# Patient Record
Sex: Female | Born: 1955 | Race: Black or African American | Hispanic: No | Smoking: Never smoker
Health system: Southern US, Community
[De-identification: ages and names within clinical notes are randomized; demographics above are authoritative.]

## PROBLEM LIST (undated history)

## (undated) DIAGNOSIS — I10 Essential (primary) hypertension: Secondary | ICD-10-CM

## (undated) DIAGNOSIS — E785 Hyperlipidemia, unspecified: Secondary | ICD-10-CM

## (undated) DIAGNOSIS — F419 Anxiety disorder, unspecified: Secondary | ICD-10-CM

## (undated) DIAGNOSIS — G7100 Muscular dystrophy, unspecified: Secondary | ICD-10-CM

## (undated) HISTORY — DX: Anxiety disorder, unspecified: F41.9

## (undated) HISTORY — DX: Essential (primary) hypertension: I10

## (undated) HISTORY — PX: BUNIONECTOMY: SHX129

## (undated) HISTORY — DX: Muscular dystrophy, unspecified: G71.00

## (undated) HISTORY — DX: Hyperlipidemia, unspecified: E78.5

---

## 2003-11-23 ENCOUNTER — Ambulatory Visit: Payer: Self-pay | Admitting: Podiatry

## 2008-04-05 ENCOUNTER — Ambulatory Visit: Payer: Self-pay | Admitting: Family Medicine

## 2009-04-18 ENCOUNTER — Ambulatory Visit: Payer: Self-pay | Admitting: Family Medicine

## 2010-12-10 ENCOUNTER — Ambulatory Visit: Payer: Self-pay | Admitting: Family Medicine

## 2011-12-11 ENCOUNTER — Ambulatory Visit: Payer: Self-pay | Admitting: Family Medicine

## 2012-01-31 ENCOUNTER — Observation Stay: Payer: Self-pay | Admitting: Specialist

## 2012-01-31 LAB — CK TOTAL AND CKMB (NOT AT ARMC)
CK, Total: 281 U/L — ABNORMAL HIGH (ref 21–215)
CK-MB: 13.6 ng/mL — ABNORMAL HIGH (ref 0.5–3.6)

## 2012-01-31 LAB — COMPREHENSIVE METABOLIC PANEL
Albumin: 4.2 g/dL (ref 3.4–5.0)
Alkaline Phosphatase: 67 U/L (ref 50–136)
Anion Gap: 9 (ref 7–16)
BUN: 10 mg/dL (ref 7–18)
Chloride: 109 mmol/L — ABNORMAL HIGH (ref 98–107)
Creatinine: 0.44 mg/dL — ABNORMAL LOW (ref 0.60–1.30)
EGFR (African American): >60
Osmolality: 286 (ref 275–301)
Potassium: 3.6 mmol/L (ref 3.5–5.1)
SGOT(AST): 29 U/L (ref 15–37)
SGPT (ALT): 27 U/L (ref 12–78)
Sodium: 144 mmol/L (ref 136–145)

## 2012-01-31 LAB — CBC
HCT: 38.5 % (ref 35.0–47.0)
MCHC: 34.6 g/dL (ref 32.0–36.0)
MCV: 96 fL (ref 80–100)
RBC: 4.03 10*6/uL (ref 3.80–5.20)
RDW: 13.1 % (ref 11.5–14.5)
WBC: 9.5 10*3/uL (ref 3.6–11.0)

## 2012-01-31 LAB — PROTIME-INR: Prothrombin Time: 11.5 secs (ref 11.5–14.7)

## 2012-01-31 LAB — TROPONIN I: Troponin-I: <0.02 ng/mL

## 2012-02-01 DIAGNOSIS — I517 Cardiomegaly: Secondary | ICD-10-CM

## 2012-02-01 LAB — URINALYSIS, COMPLETE
Bacteria: NONE SEEN
Glucose,UR: NEGATIVE mg/dL (ref 0–75)
Ketone: NEGATIVE
Leukocyte Esterase: NEGATIVE
Nitrite: NEGATIVE
Ph: 7 (ref 4.5–8.0)
Protein: NEGATIVE
Squamous Epithelial: <1
WBC UR: NONE SEEN /HPF (ref 0–5)

## 2012-02-01 LAB — LIPID PANEL
Cholesterol: 151 mg/dL (ref 0–200)
Ldl Cholesterol, Calc: 45 mg/dL (ref 0–100)
Triglycerides: 58 mg/dL (ref 0–200)
VLDL Cholesterol, Calc: 12 mg/dL (ref 5–40)

## 2014-05-19 NOTE — H&P (Signed)
PATIENT NAME:  Jeanne Taylor, Jeanne Taylor MR#:  [id number] DATE OF BIRTH:  [birth year]  DATE OF ADMISSION:  01/31/2012  PRIMARY CARE PHYSICIAN: Dr. Dennison MascotLemont Morrisey.   CHIEF COMPLAINT: Left-sided weakness and numbness and difficulty ambulating.   HISTORY OF PRESENT ILLNESS: This is a 59 year old female who presents to the Emergency Room due to an acute onset of left right-sided weakness and numbness that began around 8:00 this morning. The patient has history of muscular dystrophy and has some residual weakness all the time but this was a bit different from her weakness. The way she describes it is that when she got up and started moving, her legs did not seem to go the way she wanted it to. She felt also some tingling in her left side of her face. She, therefore, told her grandson and who brought her to the ER for further evaluation. She continues to still have some weakness on the left side, but the numbness and tingling has since then resolved. She initially did have a headache but took some BC powder, which seems to have improved her headache. She also admits to some nausea but no vomiting. She denied any chest pain, shortness of breath, fevers, chills, cough, or any other associated symptoms.   REVIEW OF SYSTEMS: CONSTITUTIONAL: No documented fever. No weight gain or weight loss.  EYES: No blurred or double vision.  ENT: No tinnitus. No postnasal drip. No redness of the oropharynx.  RESPIRATORY: No cough, no wheeze, no hemoptysis, no dyspnea.  CARDIOVASCULAR: No chest pain, no orthopnea, no palpitations, no syncope.  GASTROINTESTINAL: Positive nausea. No vomiting, diarrhea, no abdominal pain, no melena, ne hematochezia. GENITOURINARY: No dysuria, hematuria.  ENDOCRINE: No polyuria or nocturia. No heat or cold intolerance.  HEMATOLOGIC: No anemia. No bruising. No bleeding.  INTEGUMENTARY: No rashes. No lesions.  MUSCULOSKELETAL: No arthritis, no swelling, no gout.  NEUROLOGIC: Positive numbness. Positive  tingling, no ataxia, no seizure-type activity.  PSYCH: No anxiety, no insomnia, no ADD.    PAST MEDICAL HISTORY: Consistent with muscular dystrophy and hypertension.   ALLERGIES: No known drug allergies.   SOCIAL HISTORY: No smoking, drinks about 4 to 5 beers daily. No illicit drug abuse. Lives at home with her grandson.   FAMILY HISTORY: The patient's mother died from colon cancer. The father had a history of hypertension, and also had strokes.   CURRENT MEDICATIONS: Amlodipine 10 mg daily.   PHYSICAL EXAMINATION ON ADMISSION:  VITAL SIGNS: Temperature: She is afebrile, pulse 104, respirations 24, blood pressure 154/91, saturation 97% on room air.  GENERAL: She is a pleasant appearing female in no apparent distress.  HEENT: Atraumatic, normocephalic. Extraocular muscles are intact. Pupils equal, reactive to light. Sclerae anicteric. No conjunctival injection. No pharyngeal erythema.  NECK: Supple. No jugular venous distention, no bruits, no lymphadenopathy. No thyromegaly.  HEART: Regular rate and rhythm. No murmurs, rubs, or clicks.  LUNGS: Clear to auscultation bilaterally. No rales or rhonchi, no wheezes.  ABDOMEN: Soft, flat, nontender, nondistended. Has good bowel sounds. No hepatosplenomegaly appreciated.  EXTREMITIES: No evidence of any cyanosis, clubbing, or peripheral edema. Has +2 pedal and radial pulses bilaterally.  NEUROLOGICAL: The patient is alert, awake, and oriented x 3. No focal motor or sensory deficits appreciated bilaterally. Negative finger to nose. Negative heel-to-shin, +2 reflexes bilaterally. No evidence of any ataxia.  SKIN: Moist and warm with no rashes.  LYMPHATIC: There is no cervical or axillary lymphadenopathy.   LABORATORY, DIAGNOSTIC, AND RADIOLOGICAL DATA: Serum glucose 99, BUN 10,  creatinine 0.4,  sodium 144, potassium 3.6, chloride 109, bicarb 26. LFTs are within normal limits. CK 281, CK-MB 13.6. White cell count of 9.5, hemoglobin 13.4, hematocrit  38.5, platelet count of 242. INR of 0.8. The patient did have a CT of the head done which showed no evidence of any acute intracranial abnormalities.   ASSESSMENT AND PLAN: This is a 59 year old female with history of muscular dystrophy and hypertension who presents to the hospital with left-sided weakness, numbness, and difficulty ambulating.   1. Left-sided weakness/numbness. The patient's clinical symptoms are worrisome suspicious for possible transient ischemic attack/cerebral vascular accident. Her symptoms seems to have improved since they began around 8:00 this morning. Her initial CT of the head is negative. I will go ahead and get a MRI of her brain without contrast. I will also get a carotid duplex and a two-dimensional echocardiogram, continue her on aspirin. Check a lipid profile in the morning. Follow neuro checks every four hours and follow her clinically.  2. Hypertension. Continue with amlodipine, presently hemodynamically stable.  3. Muscular dystrophy. She has had this since childhood. We will get a Physical Therapy consult to assess her mobility.   CODE STATUS: The patient is a FULL CODE.   TIME SPENT ON THE ADMISSION: 45 minutes.   ____________________________ Rolly Pancake. Cherlynn Kaiser, MD vjs:jm D: 01/31/2012 13:44:34 ET T: 01/31/2012 14:28:49 ET JOB#: [id number]  cc: Rolly Pancake. Cherlynn Kaiser, MD, <Dictator> Houston Siren MD ELECTRONICALLY SIGNED 02/02/2012 8:17

## 2014-05-19 NOTE — Discharge Summary (Signed)
DATE OF BIRTH:  1955/03/31  HISTORY OF PRESENT ILLNESS:  For a detailed note, please take a look at the history and physical done on admission.   DIAGNOSES AT DISCHARGE:  Left-sided weakness and facial numbness, transient ischemic attack ruled out, history of hypertension, history of muscular dystrophy.   DIET:  The patient is being discharged on a low-sodium diet.   ACTIVITY:  As tolerated.   FOLLOWUP:  With Dr. Dennison MascotLemont Morrisey in the next 1 to 2 weeks.    DISCHARGE MEDICATIONS:  Aspirin 81 mg daily and amlodipine/benazepril combination 10/20 one tab daily.   PERTINENT STUDIES DONE DURING THE HOSPITAL COURSE:  CT scan of the head done on admission without contrast showing no evidence of acute intracranial abnormalities. An ultrasound of the carotids showing no evidence of any hemodynamically significant carotid stenosis. An MRI of the brain done without contrast also showing no evidence of any acute abnormalities. Also a 2-dimensional echocardiogram done, which shows no cardiac source of emboli. Left ventricular systolic function to be normal, mild LVH. No evidence of pulmonary hypertension.   BRIEF HOSPITAL COURSE:  This is a 59 year old female, who presented to the hospital with left-sided weakness and facial numbness.  1.  Left-sided weakness and facial numbness and difficulty walking. The patient was admitted to the hospital, as there was a concern that the patient may have had a TIA or a suspected CVA. She was observed overnight on off unit telemetry, underwent extensive testing to work up a neurologic cause of her symptoms, which were all negative including the CT head, MRI, carotid duplex and 2-dimensional echo. Her symptoms had transiently since then resolved. Therefore, TIA currently has been ruled out. The patient also had a lipid profile checked, which was essentially normal. Presently, since she has no neurological symptoms with negative imaging, she is being discharged home on a baby  aspirin as mentioned.  2.  History of hypertension. The patient remained hemodynamically stable in the hospital. She will continue amlodipine/benazepril as stated.  3.  Muscular dystrophy. The patient had no acute symptoms related to this at this point.   CODE STATUS:  The patient is a FULL CODE.   TIME SPENT ON DISCHARGE:  35 minutes.    ____________________________ Rolly PancakeVivek J. Cherlynn KaiserSainani, MD vjs:ms D: 02/01/2012 14:09:17 ET T: 02/01/2012 20:39:59 ET JOB#: [id number]  cc: Rolly PancakeVivek J. Cherlynn KaiserSainani, MD, <Dictator> Dennison MascotLemont Morrisey, MD Houston SirenVIVEK J Timothey Dahlstrom MD ELECTRONICALLY SIGNED 02/02/2012 8:17

## 2014-10-31 ENCOUNTER — Other Ambulatory Visit: Payer: Self-pay | Admitting: Family Medicine

## 2015-06-02 ENCOUNTER — Other Ambulatory Visit: Payer: Self-pay | Admitting: Family Medicine

## 2015-07-03 ENCOUNTER — Encounter: Payer: Self-pay | Admitting: Family Medicine

## 2015-07-03 ENCOUNTER — Ambulatory Visit (INDEPENDENT_AMBULATORY_CARE_PROVIDER_SITE_OTHER): Payer: Managed Care, Other (non HMO) | Admitting: Family Medicine

## 2015-07-03 VITALS — BP 110/68 | HR 114 | Temp 98.5°F | Resp 18 | Ht 65.0 in | Wt 140.1 lb

## 2015-07-03 DIAGNOSIS — G7100 Muscular dystrophy, unspecified: Secondary | ICD-10-CM

## 2015-07-03 DIAGNOSIS — J309 Allergic rhinitis, unspecified: Secondary | ICD-10-CM | POA: Diagnosis not present

## 2015-07-03 DIAGNOSIS — I10 Essential (primary) hypertension: Secondary | ICD-10-CM | POA: Diagnosis not present

## 2015-07-03 MED ORDER — BENAZEPRIL HCL 20 MG PO TABS: 20.0000 mg | ORAL_TABLET | Freq: Every day | ORAL | Status: DC

## 2015-07-03 MED ORDER — AMLODIPINE BESYLATE 10 MG PO TABS: 10.0000 mg | ORAL_TABLET | Freq: Every day | ORAL | Status: DC

## 2015-07-03 NOTE — Progress Notes (Signed)
Date:  07/03/2015   Name:  Jeanne Taylor   DOB:  07/28/1955   MRN:  [id number]  PCP:  Dennison MascotLemont Morrisey, MD    Chief Complaint: Medication Refill   History of Present Illness:  This is a 60 y.o. female requesting BP med refills. Hx AR and muscular dystrophy.  Review of Systems:  Review of Systems  Constitutional: Negative for fever and fatigue.  Respiratory: Negative for cough and shortness of breath.   Cardiovascular: Negative for chest pain and leg swelling.  Neurological: Negative for syncope and light-headedness.    Patient Active Problem List   Diagnosis Date Noted  . Hypertension 07/03/2015  . Allergic rhinitis 07/03/2015  . Muscular dystrophy (HCC) 07/03/2015    Prior to Admission medications   Medication Sig Start Date End Date Taking? Authorizing Provider  amLODipine (NORVASC) 10 MG tablet Take 1 tablet (10 mg total) by mouth daily. 07/03/15   [name], MD  benazepril (LOTENSIN) 20 MG tablet Take 1 tablet (20 mg total) by mouth daily. 07/03/15   [name], MD    No Known Allergies  No past surgical history on file.  Social History  Substance Use Topics  . Smoking status: Never Smoker   . Smokeless tobacco: None  . Alcohol Use: 0.0 oz/week    0 Standard drinks or equivalent per week    Family History  Problem Relation Age of Onset  . Hypertension Father     Medication list has been reviewed and updated.  Physical Examination: BP 110/68 mmHg  Pulse 114  Temp(Src) 98.5 F (36.9 C)  Resp 18  Ht 5\' 5"  (1.651 m)  Wt 140 lb 1 oz (63.532 kg)  BMI 23.31 kg/m2  SpO2 97%  Physical Exam  Constitutional: She appears well-developed and well-nourished.  Cardiovascular: Normal rate, regular rhythm and normal heart sounds.   Pulmonary/Chest: Effort normal and breath sounds normal.  Musculoskeletal: She exhibits no edema.  Neurological: She is alert.  Skin: Skin is warm and dry.  Psychiatric: She has a normal mood and affect. Her behavior is normal.   Nursing note and vitals reviewed.   Assessment and Plan:  1. Essential hypertension Well controlled on current meds, refill benezapril and amlodipine  2. Allergic rhinitis, unspecified allergic rhinitis type  3. Muscular dystrophy (HCC)  Return in about 1 year (around 07/02/2016).  Dionne AnoWilliam M. Kingsley SpittlePlonk, Jr. MD [facility]  07/03/2015

## 2015-07-07 ENCOUNTER — Other Ambulatory Visit: Payer: Self-pay | Admitting: Family Medicine

## 2015-07-08 ENCOUNTER — Other Ambulatory Visit: Payer: Self-pay | Admitting: Family Medicine

## 2015-09-21 ENCOUNTER — Ambulatory Visit (INDEPENDENT_AMBULATORY_CARE_PROVIDER_SITE_OTHER): Payer: Managed Care, Other (non HMO) | Admitting: Primary Care

## 2015-09-21 ENCOUNTER — Encounter: Payer: Self-pay | Admitting: Primary Care

## 2015-09-21 VITALS — BP 130/82 | HR 95 | Temp 97.8°F | Ht 65.0 in | Wt 140.4 lb

## 2015-09-21 DIAGNOSIS — J309 Allergic rhinitis, unspecified: Secondary | ICD-10-CM

## 2015-09-21 DIAGNOSIS — I10 Essential (primary) hypertension: Secondary | ICD-10-CM

## 2015-09-21 DIAGNOSIS — G7100 Muscular dystrophy, unspecified: Secondary | ICD-10-CM

## 2015-09-21 DIAGNOSIS — Z800 Family history of malignant neoplasm of digestive organs: Secondary | ICD-10-CM | POA: Insufficient documentation

## 2015-09-21 DIAGNOSIS — R531 Weakness: Secondary | ICD-10-CM

## 2015-09-21 LAB — CBC
HCT: 41 % (ref 35.0–45.0)
HEMOGLOBIN: 13.8 g/dL (ref 11.7–15.5)
MCH: 32.1 pg (ref 27.0–33.0)
MCHC: 33.7 g/dL (ref 32.0–36.0)
MCV: 95.3 fL (ref 80.0–100.0)
MPV: 10.9 fL (ref 7.5–12.5)
Platelets: 270 10*3/uL (ref 140–400)
RBC: 4.3 MIL/uL (ref 3.80–5.10)
RDW: 13.9 % (ref 11.0–15.0)
WBC: 7 10*3/uL (ref 3.8–10.8)

## 2015-09-21 LAB — COMPREHENSIVE METABOLIC PANEL
ALBUMIN: 4.3 g/dL (ref 3.6–5.1)
ALK PHOS: 46 U/L (ref 33–130)
ALT: 16 U/L (ref 6–29)
AST: 18 U/L (ref 10–35)
BILIRUBIN TOTAL: 0.7 mg/dL (ref 0.2–1.2)
BUN: 11 mg/dL (ref 7–25)
CO2: 27 mmol/L (ref 20–31)
CREATININE: 0.54 mg/dL (ref 0.50–0.99)
Calcium: 9.8 mg/dL (ref 8.6–10.4)
Chloride: 103 mmol/L (ref 98–110)
Glucose, Bld: 100 mg/dL — ABNORMAL HIGH (ref 65–99)
Potassium: 4 mmol/L (ref 3.5–5.3)
SODIUM: 141 mmol/L (ref 135–146)
TOTAL PROTEIN: 7 g/dL (ref 6.1–8.1)

## 2015-09-21 LAB — TSH: TSH: 2 m[IU]/L

## 2015-09-21 NOTE — Assessment & Plan Note (Signed)
Diagnosed in 1980's. No recent neurology follow up in 3 years. Neuro exam today unremarkable for CVA.  Good strength bilaterally. Cannot afford PT at this time. Will send to neurology for further evaluation and recommendations given progressive weakness. Labs pending today to rule out any other cause for weakness.

## 2015-09-21 NOTE — Assessment & Plan Note (Signed)
Stable in clinic today. Continue benazepril and amlodipine.

## 2015-09-21 NOTE — Assessment & Plan Note (Signed)
Increased PND and congestion. Start Zyrtec HS.  Patient to update me if no improvement.

## 2015-09-21 NOTE — Assessment & Plan Note (Signed)
Family history of her mother who was diagnosed at age 60. Strongly encouraged colonoscopy as patient has never completed. Patient will think about this and notify me of her answer.

## 2015-09-21 NOTE — Patient Instructions (Signed)
You will be contacted regarding your referral to Neurology.  Please let us know if you have not heard back within one week.   Complete lab work prior to leaving today. I will notify you of your results once received.   Please drop off a copy of your biometric lab results at your convenience.   It was a pleasure to meet you today! Please don't hesitate to call me with any questions. Welcome to [facility]!

## 2015-09-21 NOTE — Progress Notes (Signed)
Pre visit review using our clinic review tool, if applicable. No additional management support is needed unless otherwise documented below in the visit note. 

## 2015-09-21 NOTE — Progress Notes (Signed)
Subjective:    Patient ID: Jeanne Taylor, female    DOB: 02/14/1955, 60 y.o.   MRN: [id number]  HPI  Ms. Jeanne Taylor is a 60 year old female who presents today to establish care and discuss the problems mentioned below. Will obtain old records. She completed biometric testing at work several months ago and endorses a normal cholesterol reading.   1) Essential Hypertension: Diagnosed years ago. Currently managed on Amlodipine 10 mg and Benazepril 20 mg. She checks her blood pressure intermittently at work with normal readings.   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 Her weakness as progressed over the years since diagnosis. Over the past 3 years she's noticed more of a progression of her weakness mostly to the left side.   Over the past several weeks she has experienced weakness to her left upper and lower extremities where she feels like she cannot hold herself up. She has noticed numbness/tingling to her finger tips on the left side.   Denies dizziness, chest pain, nausea, shortness of breath, neck pain. Full stroke work up in the ED in 2014 which was negative. She [facility] who recommended physical therapy for which she could not afford. Her last visit with the neurologist was three years ago.   3) Family history of Colon Cancer: Family history in mother who was diagnosed at age 60. She has never had a colonoscopy. Denies unexplained weight loss, bloody stools, abdominal pain.   Review of Systems  Constitutional: Negative for fatigue and unexpected weight change.  HENT: Positive for postnasal drip.   Respiratory: Negative for shortness of breath.   Cardiovascular: Negative for chest pain.  Gastrointestinal: Negative for blood in stool, constipation and diarrhea.  Neurological: Positive for weakness. Negative for dizziness, facial asymmetry, speech difficulty, numbness and headaches.       Past Medical History:    Diagnosis Date  . Anxiety   . Hyperlipidemia   . Hypertension      Social History   Social History  . Marital status: Divorced    Spouse name: N/A  . Number of children: N/A  . Years of education: N/A   Occupational History  . Not on file.   Social History Main Topics  . Smoking status: Never Smoker  . Smokeless tobacco: Never Used  . Alcohol use 0.0 oz/week     Comment: social  . Drug use: Unknown  . Sexual activity: Not on file   Other Topics Concern  . Not on file   Social History Narrative   Single.   1 daughter, adopted.   No grandchildren.   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   Lives at home with her Harlow MaresGodson.    Enjoys cooking, cleaning, decorating.     Past Surgical History:  Procedure Laterality Date  . BUNIONECTOMY Right     Family History  Problem Relation Age of Onset  . Colon cancer Mother   . Hypertension Father   . Muscular dystrophy Brother     No Known Allergies  Current Outpatient Prescriptions on File Prior to Visit  Medication Sig Dispense Refill  . amLODipine (NORVASC) 10 MG tablet Take 1 tablet (10 mg total) by mouth daily. 90 tablet 3  . benazepril (LOTENSIN) 20 MG tablet Take 1 tablet (20 mg total) by mouth daily. 90 tablet 3   No current facility-administered medications on file prior to visit.     BP 130/82   Pulse 95   Temp  97.8 F (36.6 C) (Oral)   Ht 5\' 5"  (1.651 m)   Wt 140 lb 6.4 oz (63.7 kg)   SpO2 98%   BMI 23.36 kg/m    Objective:   Physical Exam  Constitutional: She is oriented to person, place, and time. She appears well-nourished.  Eyes: EOM are normal. Pupils are equal, round, and reactive to light.  Neck: Neck supple.  Cardiovascular: Normal rate and regular rhythm.   Pulmonary/Chest: Effort normal and breath sounds normal.  Neurological: She is alert and oriented to person, place, and time. She displays normal reflexes. No cranial nerve deficit. Coordination normal.  Skin: Skin is warm and dry.           Assessment & Plan:

## 2015-09-22 LAB — VITAMIN D 25 HYDROXY (VIT D DEFICIENCY, FRACTURES): Vit D, 25-Hydroxy: 7 ng/mL — ABNORMAL LOW (ref 30–100)

## 2015-09-22 LAB — VITAMIN B12: VITAMIN B 12: 243 pg/mL (ref 200–1100)

## 2015-09-23 ENCOUNTER — Other Ambulatory Visit: Payer: Self-pay | Admitting: Primary Care

## 2015-09-23 DIAGNOSIS — E559 Vitamin D deficiency, unspecified: Secondary | ICD-10-CM

## 2015-09-23 MED ORDER — VITAMIN D (ERGOCALCIFEROL) 1.25 MG (50000 UNIT) PO CAPS: ORAL_CAPSULE | ORAL | 0 refills | Status: DC

## 2015-09-25 ENCOUNTER — Encounter: Payer: Self-pay | Admitting: *Deleted

## 2015-12-13 ENCOUNTER — Other Ambulatory Visit: Payer: Self-pay | Admitting: Primary Care

## 2015-12-13 DIAGNOSIS — E559 Vitamin D deficiency, unspecified: Secondary | ICD-10-CM

## 2015-12-17 ENCOUNTER — Other Ambulatory Visit: Payer: Self-pay | Admitting: Primary Care

## 2015-12-17 DIAGNOSIS — E559 Vitamin D deficiency, unspecified: Secondary | ICD-10-CM

## 2015-12-26 ENCOUNTER — Other Ambulatory Visit (INDEPENDENT_AMBULATORY_CARE_PROVIDER_SITE_OTHER): Payer: Managed Care, Other (non HMO)

## 2015-12-26 DIAGNOSIS — E559 Vitamin D deficiency, unspecified: Secondary | ICD-10-CM

## 2015-12-27 ENCOUNTER — Other Ambulatory Visit: Payer: Self-pay | Admitting: Primary Care

## 2015-12-27 DIAGNOSIS — E559 Vitamin D deficiency, unspecified: Secondary | ICD-10-CM

## 2015-12-27 LAB — VITAMIN D 25 HYDROXY (VIT D DEFICIENCY, FRACTURES): VITD: 19.43 ng/mL — ABNORMAL LOW (ref 30.00–100.00)

## 2015-12-27 MED ORDER — VITAMIN D (ERGOCALCIFEROL) 1.25 MG (50000 UNIT) PO CAPS: ORAL_CAPSULE | ORAL | 0 refills | Status: DC

## 2016-03-20 ENCOUNTER — Telehealth: Payer: Self-pay | Admitting: Primary Care

## 2016-03-20 DIAGNOSIS — E559 Vitamin D deficiency, unspecified: Secondary | ICD-10-CM

## 2016-03-20 NOTE — Telephone Encounter (Signed)
Please notify her we will review her upcoming vitamin D lab before we decide to refill this medication. She may be stable on the OTC dose. We will be in touch.

## 2016-03-20 NOTE — Telephone Encounter (Signed)
Ok to refill? Electronically refill request for Vitamin D, Ergocalciferol, (DRISDOL) 50000 units CAPS capsule. Last prescribed on 12/27/2015. Last seen on 09/21/2015. Lab appt on 03/27/2016

## 2016-03-20 NOTE — Telephone Encounter (Signed)
Message left for patient to return my call.  

## 2016-03-24 NOTE — Telephone Encounter (Signed)
Patient returned Chan's call.  I let patient know Kate's comments. Patient voiced understanding.

## 2016-03-24 NOTE — Telephone Encounter (Signed)
Message left for patient to return my call.  

## 2016-03-27 ENCOUNTER — Ambulatory Visit (INDEPENDENT_AMBULATORY_CARE_PROVIDER_SITE_OTHER): Payer: Managed Care, Other (non HMO) | Admitting: Internal Medicine

## 2016-03-27 ENCOUNTER — Other Ambulatory Visit: Payer: Managed Care, Other (non HMO)

## 2016-03-27 ENCOUNTER — Encounter (INDEPENDENT_AMBULATORY_CARE_PROVIDER_SITE_OTHER): Payer: Self-pay

## 2016-03-27 ENCOUNTER — Encounter: Payer: Self-pay | Admitting: Internal Medicine

## 2016-03-27 VITALS — BP 124/80 | HR 98 | Temp 98.0°F | Wt 142.0 lb

## 2016-03-27 DIAGNOSIS — E559 Vitamin D deficiency, unspecified: Secondary | ICD-10-CM | POA: Diagnosis not present

## 2016-03-27 DIAGNOSIS — R0781 Pleurodynia: Secondary | ICD-10-CM | POA: Diagnosis not present

## 2016-03-27 LAB — VITAMIN D 25 HYDROXY (VIT D DEFICIENCY, FRACTURES): VITD: 21.91 ng/mL — ABNORMAL LOW (ref 30.00–100.00)

## 2016-03-27 NOTE — Progress Notes (Signed)
Pre visit review using our clinic review tool, if applicable. No additional management support is needed unless otherwise documented below in the visit note. 

## 2016-03-27 NOTE — Patient Instructions (Signed)
Rib Fracture A rib fracture is a break or crack in one of the bones of the ribs. The ribs are like a cage that goes around your upper chest. A broken or cracked rib is often painful, but most do not cause other problems. Most rib fractures heal on their own in 1-3 months. Follow these instructions at home:  Avoid activities that cause pain to the injured area. Protect your injured area.  Slowly increase activity as told by your doctor.  Take medicine as told by your doctor.  Put ice on the injured area for the first 1-2 days after you have been treated or as told by your doctor.  Put ice in a plastic bag.  Place a towel between your skin and the bag.  Leave the ice on for 15-20 minutes at a time, every 2 hours while you are awake.  Do deep breathing as told by your doctor. You may be told to:  Take deep breaths many times a day.  Cough many times a day while hugging a pillow.  Use a device (incentive spirometer) to perform deep breathing many times a day.  Drink enough fluids to keep your pee (urine) clear or pale yellow.  Do not wear a rib belt or binder. These do not allow you to breathe deeply. Get help right away if:  You have a fever.  You have trouble breathing.  You cannot stop coughing.  You cough up thick or bloody spit (mucus).  You feel sick to your stomach (nauseous), throw up (vomit), or have belly (abdominal) pain.  Your pain gets worse and medicine does not help. This information is not intended to replace advice given to you by your health care provider. Make sure you discuss any questions you have with your health care provider. Document Released: 10/23/2007 Document Revised: 06/21/2015 Document Reviewed: 03/17/2012 Elsevier Interactive Patient Education  2017 Elsevier Inc.  

## 2016-03-27 NOTE — Progress Notes (Signed)
Subjective:    Patient ID: Jeanne Taylor, female    DOB: 07/21/1955, 61 y.o.   MRN: [id number]  HPI  Pt presents to the clinic today with c/o left side rib pain. She reports she was cleaning her tub on Sunday. She was leaning over the tub when she heard a "crushing" sound. She had a lot of pain at that time. She reports now she has a "pulling" sensation in the area. She describes it as sore and achy. It is worse with movement, turning over in bed and taking deep breaths. She has no difficulty breathing. She has tried Ibuprofen and Etodolac with some relief. She has a history of muscular dystrophy.  Review of Systems      Past Medical History:  Diagnosis Date  . Anxiety   . Hyperlipidemia   . Hypertension     Current Outpatient Prescriptions  Medication Sig Dispense Refill  . amLODipine (NORVASC) 10 MG tablet Take 1 tablet (10 mg total) by mouth daily. 90 tablet 3  . benazepril (LOTENSIN) 20 MG tablet Take 1 tablet (20 mg total) by mouth daily. 90 tablet 3  . Vitamin D, Ergocalciferol, (DRISDOL) 50000 units CAPS capsule Take 1 capsule by mouth once weekly for a total of 12 weeks. 12 capsule 0  . etodolac (LODINE) 400 MG tablet      No current facility-administered medications for this visit.     No Known Allergies  Family History  Problem Relation Age of Onset  . Colon cancer Mother   . Hypertension Father   . Muscular dystrophy Brother     Social History   Social History  . Marital status: Divorced    Spouse name: N/A  . Number of children: N/A  . Years of education: N/A   Occupational History  . Not on file.   Social History Main Topics  . Smoking status: Never Smoker  . Smokeless tobacco: Never Used  . Alcohol use 0.0 oz/week     Comment: social  . Drug use: Unknown  . Sexual activity: Not on file   Other Topics Concern  . Not on file   Social History Narrative   Single.   1 daughter, adopted.   No grandchildren.   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   Lives at home with her Harlow MaresGodson.    Enjoys cooking, cleaning, decorating.      Constitutional: Denies fever, malaise, fatigue, headache or abrupt weight changes.  Respiratory: Denies difficulty breathing, shortness of breath, cough or sputum production.   Cardiovascular: Denies chest pain, chest tightness, palpitations or swelling in the hands or feet.  Musculoskeletal: Pt reports left side rib pain. Denies muscle pain or joint swelling.   No other specific complaints in a complete review of systems (except as listed in HPI above).  Objective:   Physical Exam   BP 124/80   Pulse 98   Temp 98 F (36.7 C) (Oral)   Wt 142 lb (64.4 kg)   SpO2 98%   BMI 23.63 kg/m  Wt Readings from Last 3 Encounters:  03/27/16 142 lb (64.4 kg)  09/21/15 140 lb 6.4 oz (63.7 kg)  07/03/15 140 lb 1 oz (63.5 kg)    General: Appears her stated age, in NAD. Cardiovascular: Normal rate and rhythm.  Pulmonary/Chest: Normal effort and positive vesicular breath sounds. No respiratory distress. No wheezes, rales or ronchi noted.  Abdomen: Soft and nontender. Normal bowel sounds.  Musculoskeletal: Pain with palpation of the left lower anterior ribs.  BMET    Component Value Date/Time   NA 141 09/21/2015 1459   NA 144 01/31/2012 1110   K 4.0 09/21/2015 1459   K 3.6 01/31/2012 1110   CL 103 09/21/2015 1459   CL 109 (H) 01/31/2012 1110   CO2 27 09/21/2015 1459   CO2 26 01/31/2012 1110   GLUCOSE 100 (H) 09/21/2015 1459   GLUCOSE 99 01/31/2012 1110   BUN 11 09/21/2015 1459   BUN 10 01/31/2012 1110   CREATININE 0.54 09/21/2015 1459   CALCIUM 9.8 09/21/2015 1459   CALCIUM 8.7 01/31/2012 1110   GFRNONAA >60 01/31/2012 1110   GFRAA >60 01/31/2012 1110    Lipid Panel     Component Value Date/Time   CHOL 151 02/01/2012 0540   TRIG 58 02/01/2012 0540   HDL 94 (H) 02/01/2012 0540   VLDL 12 02/01/2012 0540   LDLCALC 45 02/01/2012 0540    CBC    Component Value Date/Time   WBC 7.0 09/21/2015  1459   RBC 4.30 09/21/2015 1459   HGB 13.8 09/21/2015 1459   HGB 13.4 01/31/2012 1110   HCT 41.0 09/21/2015 1459   HCT 38.5 01/31/2012 1110   PLT 270 09/21/2015 1459   PLT 242 01/31/2012 1110   MCV 95.3 09/21/2015 1459   MCV 96 01/31/2012 1110   MCH 32.1 09/21/2015 1459   MCHC 33.7 09/21/2015 1459   RDW 13.9 09/21/2015 1459   RDW 13.1 01/31/2012 1110    Hgb A1C No results found for: HGBA1C         Assessment & Plan:   Left Side Rib Pain:  Discussed the likelihood of fracture, but the lack of support for chest xray and rib binding Advised her this should resolve in 4-6 weeks Continue Ibuprofen and Etodolac A heating pad may be helpful  RTC as needed or if symptoms persist or worsen Treyon Wymore, NP

## 2016-03-27 NOTE — Addendum Note (Signed)
Addended by: Baldomero LamyHAVERS, NATASHA C on: 03/27/2016 03:20 PM   Modules accepted: Orders

## 2016-03-30 ENCOUNTER — Other Ambulatory Visit: Payer: Self-pay | Admitting: Primary Care

## 2016-03-30 DIAGNOSIS — E559 Vitamin D deficiency, unspecified: Secondary | ICD-10-CM

## 2016-03-30 MED ORDER — VITAMIN D (ERGOCALCIFEROL) 1.25 MG (50000 UNIT) PO CAPS: ORAL_CAPSULE | ORAL | 0 refills | Status: DC

## 2016-03-31 ENCOUNTER — Telehealth: Payer: Self-pay | Admitting: Primary Care

## 2016-03-31 NOTE — Telephone Encounter (Signed)
Spoken to patient and notified patient that Rx has been sent. Apologized for the inconvenience.

## 2016-03-31 NOTE — Telephone Encounter (Signed)
Patient said she spoke to [place] about her lab results.  Johny DrillingChan said she would call in a rx for Vitamin D.  Patient checked with [facility] and they didn't have the rx.

## 2016-06-18 ENCOUNTER — Ambulatory Visit (INDEPENDENT_AMBULATORY_CARE_PROVIDER_SITE_OTHER): Payer: Managed Care, Other (non HMO) | Admitting: Family Medicine

## 2016-06-18 ENCOUNTER — Encounter: Payer: Self-pay | Admitting: Family Medicine

## 2016-06-18 DIAGNOSIS — M545 Low back pain, unspecified: Secondary | ICD-10-CM

## 2016-06-18 DIAGNOSIS — E559 Vitamin D deficiency, unspecified: Secondary | ICD-10-CM

## 2016-06-18 MED ORDER — ETODOLAC 400 MG PO TABS: 400.0000 mg | ORAL_TABLET | Freq: Two times a day (BID) | ORAL | 0 refills | Status: DC | PRN

## 2016-06-18 MED ORDER — CYCLOBENZAPRINE HCL 10 MG PO TABS: 5.0000 mg | ORAL_TABLET | Freq: Every evening | ORAL | 0 refills | Status: DC | PRN

## 2016-06-18 NOTE — Patient Instructions (Addendum)
Go to the lab on the way out.  We'll contact you with your lab report (vitamin D).   Take lodine with food.  Use the least amount possible.  Flexeril at night, as needed, sedation caution.  Ice and heat in the meantime.  Take care.  Glad to see you.  Update us as needed.

## 2016-06-18 NOTE — Progress Notes (Signed)
She is due for f/u vit D level, done with weekly replacement rx.    She was getting out of the shower recently earlier in the week.  She has scoliosis and muscular dystrophy at baseline.  She twisted, didn't fally, then had R sided back/flank pain without R leg pain.  Pain walking, now using cane and usually doesn't need cane.  No L sided sx.  No B/B sx.  No fevers or chills.  No trauma.  No dysuria.  No bruising.  She had recently started lodine w/o much relief.  She tried biofreeze with some relief.    She has more pain trying to get up from a chair after prolonged sitting.    Meds, vitals, and allergies reviewed.   ROS: Per HPI unless specifically indicated in ROS section   GEN: nad, alert and oriented HEENT: mucous membranes moist NECK: supple w/o LA CV: rrr. PULM: ctab, no inc wob ABD: soft, +bs EXT: no edema Back without midline pain. No CVA pain. She has scoliosis and scapular dysfunction at baseline. She has pain in the lower back but the area is not additionally tender with palpation. No rash. Walking with gait at baseline with chronic gait changes noted. SLR neg B

## 2016-06-19 DIAGNOSIS — M549 Dorsalgia, unspecified: Secondary | ICD-10-CM

## 2016-06-19 DIAGNOSIS — G8929 Other chronic pain: Secondary | ICD-10-CM | POA: Insufficient documentation

## 2016-06-19 DIAGNOSIS — E559 Vitamin D deficiency, unspecified: Secondary | ICD-10-CM | POA: Insufficient documentation

## 2016-06-19 LAB — VITAMIN D 25 HYDROXY (VIT D DEFICIENCY, FRACTURES): VITD: 32.12 ng/mL (ref 30.00–100.00)

## 2016-06-19 NOTE — Assessment & Plan Note (Signed)
Vitamin D level pending

## 2016-06-19 NOTE — Assessment & Plan Note (Signed)
Likely a strain. No need for imaging at this point. Discussed with patient.  Take lodine with food.  Use the least amount possible.  Nsaid caution discussed with patient. Flexeril at night, as needed, sedation caution.   Ice and heat in the meantime.  Update us as needed.  She agrees.

## 2016-06-20 ENCOUNTER — Other Ambulatory Visit: Payer: Self-pay | Admitting: Primary Care

## 2016-06-20 DIAGNOSIS — E559 Vitamin D deficiency, unspecified: Secondary | ICD-10-CM

## 2016-07-20 ENCOUNTER — Other Ambulatory Visit: Payer: Self-pay | Admitting: Family Medicine

## 2016-07-20 DIAGNOSIS — I10 Essential (primary) hypertension: Secondary | ICD-10-CM

## 2016-07-21 MED ORDER — AMLODIPINE BESYLATE 10 MG PO TABS: 10.0000 mg | ORAL_TABLET | Freq: Every day | ORAL | 0 refills | Status: DC

## 2016-07-21 NOTE — Telephone Encounter (Signed)
Please notify patient that she's due for an office follow up visit in late August/early September 2018. Please schedule at her convenience.

## 2016-07-21 NOTE — Telephone Encounter (Signed)
Pt request refill for amlodipine as well as cyclobenzaprine; Mayra ReelKate Clark NP has not filled the amlodipine before; pt last seen 06/18/16.Please advise.

## 2016-07-22 NOTE — Telephone Encounter (Signed)
Spoken to patient and schedule CPE on 09/24/2016

## 2016-07-29 ENCOUNTER — Other Ambulatory Visit: Payer: Self-pay

## 2016-07-29 MED ORDER — BENAZEPRIL HCL 20 MG PO TABS: 20.0000 mg | ORAL_TABLET | Freq: Every day | ORAL | 0 refills | Status: DC

## 2016-07-29 NOTE — Telephone Encounter (Signed)
[facility] Refilled x 1 per protocol. Pt has CPX 09/24/16. Pt notified refill done and pt voiced understanding.

## 2016-08-14 ENCOUNTER — Emergency Department: Payer: Managed Care, Other (non HMO)

## 2016-08-14 ENCOUNTER — Encounter: Payer: Self-pay | Admitting: Emergency Medicine

## 2016-08-14 ENCOUNTER — Other Ambulatory Visit: Payer: Self-pay

## 2016-08-14 ENCOUNTER — Emergency Department
Admission: EM | Admit: 2016-08-14 | Discharge: 2016-08-14 | Disposition: A | Discharge status: Home / Self Care | Payer: Managed Care, Other (non HMO) | Attending: Emergency Medicine | Admitting: Emergency Medicine

## 2016-08-14 DIAGNOSIS — R531 Weakness: Secondary | ICD-10-CM | POA: Diagnosis not present

## 2016-08-14 DIAGNOSIS — M6281 Muscle weakness (generalized): Secondary | ICD-10-CM | POA: Insufficient documentation

## 2016-08-14 DIAGNOSIS — I10 Essential (primary) hypertension: Secondary | ICD-10-CM | POA: Diagnosis not present

## 2016-08-14 DIAGNOSIS — Z79899 Other long term (current) drug therapy: Secondary | ICD-10-CM | POA: Diagnosis not present

## 2016-08-14 LAB — DIFFERENTIAL
Basophils Absolute: 0.1 10*3/uL (ref 0–0.1)
Basophils Relative: 1 %
EOS PCT: 1 %
Eosinophils Absolute: 0.1 10*3/uL (ref 0–0.7)
LYMPHS ABS: 2.1 10*3/uL (ref 1.0–3.6)
LYMPHS PCT: 27 %
Monocytes Absolute: 0.8 10*3/uL (ref 0.2–0.9)
Monocytes Relative: 10 %
NEUTROS ABS: 4.7 10*3/uL (ref 1.4–6.5)
NEUTROS PCT: 61 %

## 2016-08-14 LAB — CBC
HCT: 39.6 % (ref 35.0–47.0)
HEMOGLOBIN: 13.7 g/dL (ref 12.0–16.0)
MCH: 32.6 pg (ref 26.0–34.0)
MCHC: 34.6 g/dL (ref 32.0–36.0)
MCV: 94.4 fL (ref 80.0–100.0)
PLATELETS: 266 10*3/uL (ref 150–440)
RBC: 4.2 MIL/uL (ref 3.80–5.20)
RDW: 13 % (ref 11.5–14.5)
WBC: 7.8 10*3/uL (ref 3.6–11.0)

## 2016-08-14 LAB — PROTIME-INR
INR: 0.9
PROTHROMBIN TIME: 12.1 s (ref 11.4–15.2)

## 2016-08-14 LAB — COMPREHENSIVE METABOLIC PANEL
ALBUMIN: 4.5 g/dL (ref 3.5–5.0)
ALK PHOS: 51 U/L (ref 38–126)
ALT: 20 U/L (ref 14–54)
AST: 27 U/L (ref 15–41)
Anion gap: 10 (ref 5–15)
BILIRUBIN TOTAL: 0.9 mg/dL (ref 0.3–1.2)
BUN: 11 mg/dL (ref 6–20)
CALCIUM: 9.8 mg/dL (ref 8.9–10.3)
CO2: 26 mmol/L (ref 22–32)
CREATININE: 0.45 mg/dL (ref 0.44–1.00)
Chloride: 105 mmol/L (ref 101–111)
GFR calc Af Amer: >60 mL/min (ref >60)
GFR calc non Af Amer: >60 mL/min (ref >60)
GLUCOSE: 126 mg/dL — AB (ref 65–99)
Potassium: 3.3 mmol/L — ABNORMAL LOW (ref 3.5–5.1)
SODIUM: 141 mmol/L (ref 135–145)
TOTAL PROTEIN: 7.8 g/dL (ref 6.5–8.1)

## 2016-08-14 LAB — TROPONIN I: Troponin I: <0.03 ng/mL (ref <0.03)

## 2016-08-14 LAB — APTT: aPTT: 24 seconds (ref 24–36)

## 2016-08-14 LAB — GLUCOSE, CAPILLARY: Glucose-Capillary: 119 mg/dL — ABNORMAL HIGH (ref 65–99)

## 2016-08-14 MED ORDER — ASPIRIN EC 325 MG PO TBEC
325.0000 mg | DELAYED_RELEASE_TABLET | Freq: Once | ORAL | Status: AC
  Administered 2016-08-14: 325 mg via ORAL
  Filled 2016-08-14: qty 1

## 2016-08-14 NOTE — ED Provider Notes (Signed)
[facility] Emergency Department Provider Note   ____________________________________________    I have reviewed the triage vital signs and the nursing notes.   HISTORY  Chief Complaint Code Stroke     HPI Jeanne Taylor is a 61 y.o. female who presents with complaints of left-sided weakness. Patient reports she always has some weakness in her left side but it seems to be worse today. She woke up with these symptoms. She says she has had this in the past and has been related to vitamin D deficiency. She reports she had an unusual feeling at work today and so her coworkers sent her to the emergency department. No nausea or vomiting. No headache   Past Medical History:  Diagnosis Date  . Anxiety   . Hyperlipidemia   . Hypertension   . Muscular dystrophy [facility])     Patient Active Problem List   Diagnosis Date Noted  . Back pain 06/19/2016  . Vitamin D deficiency 06/19/2016  . Family history of colon cancer 09/21/2015  . Hypertension 07/03/2015  . Allergic rhinitis 07/03/2015  . Muscular dystrophy (HCC) 07/03/2015    Past Surgical History:  Procedure Laterality Date  . BUNIONECTOMY Right     Prior to Admission medications   Medication Sig Start Date End Date Taking? Authorizing Provider  amLODipine (NORVASC) 10 MG tablet Take 1 tablet (10 mg total) by mouth daily. 07/21/16  Yes Doreene Nest, NP  benazepril (LOTENSIN) 20 MG tablet Take 1 tablet (20 mg total) by mouth daily. 07/29/16  Yes Doreene Nest, NP  cyclobenzaprine (FLEXERIL) 10 MG tablet Take 0.5-1 tablets (5-10 mg total) by mouth at bedtime as needed for muscle spasms. 06/18/16  Yes Joaquim Nam, MD  etodolac (LODINE) 400 MG tablet Take 1 tablet (400 mg total) by mouth 2 (two) times daily as needed. 06/18/16  Yes Joaquim Nam, MD     Allergies Patient has no known allergies.  Family History  Problem Relation Age of Onset  . Colon cancer Mother   . Hypertension  Father   . Muscular dystrophy Brother     Social History Social History  Substance Use Topics  . Smoking status: Never Smoker  . Smokeless tobacco: Never Used  . Alcohol use 0.0 oz/week     Comment: social    Review of Systems  Constitutional: No fever/chills Eyes: No visual changes.  ENT: No Neck pain Cardiovascular: Denies chest pain. Respiratory: Denies shortness of breath. Gastrointestinal: No abdominal pain.  No nausea, no vomiting.   Genitourinary: Negative for dysuria. Musculoskeletal: Negative for back pain. Skin: Negative for rash. Neurological: Negative for headaches   ____________________________________________   PHYSICAL EXAM:  VITAL SIGNS: ED Triage Vitals  Enc Vitals Group     BP 08/14/16 1157 (!) 153/85     Pulse Rate 08/14/16 1157 100     Resp 08/14/16 1157 18     Temp 08/14/16 1157 98 F (36.7 C)     Temp Source 08/14/16 1157 Oral     SpO2 08/14/16 1157 100 %     Weight 08/14/16 1225 64.4 kg (142 lb)     Height 08/14/16 1225 1.626 m (5\' 4" )     Head Circumference --      Peak Flow --      Pain Score --      Pain Loc --      Pain Edu? --      Excl. in GC? --     Constitutional:  Alert and oriented. No acute distress. Pleasant and interactive Eyes: Conjunctivae are normal.   Nose: No congestion/rhinnorhea. Mouth/Throat: Mucous membranes are moist.    Cardiovascular: Normal rate, regular rhythm.  Good peripheral circulation. Respiratory: Normal respiratory effort.  No retractions.  Musculoskeletal: No lower extremity tenderness nor edema.  Warm and well perfused, Strength appears equal in all extremities Neurologic:  Normal speech and language. No gross focal neurologic deficits are appreciated.  Skin:  Skin is warm, dry and intact. No rash noted. Psychiatric: Mood and affect are normal. Speech and behavior are normal.  ____________________________________________   LABS (all labs ordered are listed, but only abnormal results are  displayed)  Labs Reviewed  COMPREHENSIVE METABOLIC PANEL - Abnormal; Notable for the following:       Result Value   Potassium 3.3 (*)    Glucose, Bld 126 (*)    All other components within normal limits  GLUCOSE, CAPILLARY - Abnormal; Notable for the following:    Glucose-Capillary 119 (*)    All other components within normal limits  PROTIME-INR  APTT  CBC  DIFFERENTIAL  TROPONIN I  CBG MONITORING, ED   ____________________________________________  EKG  ED ECG REPORT I, Jene EveryKINNER, Cristine Daw, the attending physician, personally viewed and interpreted this ECG.  Date: 08/14/2016 EKG Time: 12 PM Rate: 104 Rhythm: Sinus tachycardia QRS Axis: normal Intervals: normal ST/T Wave abnormalities: normal  ____________________________________________  RADIOLOGY  CT head unremarkable, MRI brain unremarkable ____________________________________________   PROCEDURES  Procedure(s) performed: No    Critical Care performed: No ____________________________________________   INITIAL IMPRESSION / ASSESSMENT AND PLAN / ED COURSE  Pertinent labs & imaging results that were available during my care of the patient were reviewed by me and considered in my medical decision making (see chart for details).  Called a code stroke in triage, seen by Dr. Thad Rangereynolds of neurology who feels this is unlikely to be CVA. CT head unremarkable. Dr. Thad Rangereynolds recommends MRI brain and if normal appropriate for discharge and outpatient follow-up.   MRI brain is unremarkable, discuss results with patient, she is greatly reassured. She will follow-up with neurology as an outpatient she is to return if any change in her symptoms.    ____________________________________________   FINAL CLINICAL IMPRESSION(S) / ED DIAGNOSES  Final diagnoses:  Left-sided weakness      NEW MEDICATIONS STARTED DURING THIS VISIT:  New Prescriptions   No medications on file     Note:  This document was prepared  using Dragon voice recognition software and may include unintentional dictation errors.    Jene EveryKinner, Illana Nolting, MD 08/14/16 [phone]

## 2016-08-14 NOTE — ED Notes (Signed)
Pt in MRI.

## 2016-08-14 NOTE — Progress Notes (Signed)
   08/14/16 1210  Clinical Encounter Type  Visited With Patient not available;Health care provider  Visit Type Initial;ED  Referral From Other (Comment) (Page stroke)   [facility] responded to find patient taken to Cath Lab. [facility] will provide assistance as needed.

## 2016-08-14 NOTE — ED Triage Notes (Signed)
Pt presents to ED c/o weakness to left side this morning. Hx of muscular dystrophy

## 2016-08-14 NOTE — Consult Note (Signed)
Referring Physician: Cyril Loosen    Chief Complaint: Left sided weakness  HPI: Jeanne Taylor is an 61 y.o. female with a history of MD who reports that over the years she has had episodes when her left side will feel weaker.  Has been told in the past that it is related to her low Vitamin D levels.  At baseline her left sided is worse.  Reports that today she awakened feeling that her left was weaker than usual.  She was able to go to work and while there experienced an unusual feeling that came over her.  Afterward felt she was having more problems with her left side.  Her co-workers became concerned and asked that she come for evaluation.  Initial NIHSS of 0.    Date last known well: Date: 08/13/2016 Time last known well: Time: 20:30 tPA Given: No: Outside time window  Past Medical History:  Diagnosis Date  . Anxiety   . Hyperlipidemia   . Hypertension   . Muscular dystrophy [facility])     Past Surgical History:  Procedure Laterality Date  . BUNIONECTOMY Right     Family History  Problem Relation Age of Onset  . Colon cancer Mother   . Hypertension Father   . Muscular dystrophy Brother    Social History:  reports that she has never smoked. She has never used smokeless tobacco. She reports that she drinks alcohol. She reports that she does not use drugs.  Allergies: No Known Allergies  Medications: I have reviewed the patient's current medications. Prior to Admission:  Prior to Admission medications   Medication Sig Start Date End Date Taking? Authorizing Provider  amLODipine (NORVASC) 10 MG tablet Take 1 tablet (10 mg total) by mouth daily. 07/21/16  Yes Doreene Nest, NP  benazepril (LOTENSIN) 20 MG tablet Take 1 tablet (20 mg total) by mouth daily. 07/29/16  Yes Doreene Nest, NP  cyclobenzaprine (FLEXERIL) 10 MG tablet Take 0.5-1 tablets (5-10 mg total) by mouth at bedtime as needed for muscle spasms. 06/18/16  Yes Joaquim Nam, MD  etodolac (LODINE) 400 MG tablet Take 1  tablet (400 mg total) by mouth 2 (two) times daily as needed. 06/18/16  Yes Joaquim Nam, MD    ROS: History obtained from the patient  General ROS: negative for - chills, fatigue, fever, night sweats, weight gain or weight loss Psychological ROS: negative for - behavioral disorder, hallucinations, memory difficulties, mood swings or suicidal ideation Ophthalmic ROS: negative for - blurry vision, double vision, eye pain or loss of vision ENT ROS: negative for - epistaxis, nasal discharge, oral lesions, sore throat, tinnitus or vertigo Allergy and Immunology ROS: negative for - hives or itchy/watery eyes Hematological and Lymphatic ROS: negative for - bleeding problems, bruising or swollen lymph nodes Endocrine ROS: negative for - galactorrhea, hair pattern changes, polydipsia/polyuria or temperature intolerance Respiratory ROS: negative for - cough, hemoptysis, shortness of breath or wheezing Cardiovascular ROS: negative for - chest pain, dyspnea on exertion, edema or irregular heartbeat Gastrointestinal ROS: negative for - abdominal pain, diarrhea, hematemesis, nausea/vomiting or stool incontinence Genito-Urinary ROS: negative for - dysuria, hematuria, incontinence or urinary frequency/urgency Musculoskeletal ROS: muscular weakness Neurological ROS: as noted in HPI Dermatological ROS: negative for rash and skin lesion changes  Physical Examination: Blood pressure (!) 153/85, pulse 100, temperature 98 F (36.7 C), temperature source Oral, resp. rate 18, height 5\' 4"  (1.626 m), weight 64.4 kg (142 lb), SpO2 100 %.  HEENT-  Normocephalic, no lesions, without obvious  abnormality.  Normal external eye and conjunctiva.  Normal TM's bilaterally.  Normal auditory canals and external ears. Normal external nose, mucus membranes and septum.  Normal pharynx. Cardiovascular- S1, S2 normal, pulses palpable throughout   Lungs- chest clear, no wheezing, rales, normal symmetric air entry Abdomen-  soft, non-tender; bowel sounds normal; no masses,  no organomegaly Extremities- no edema Lymph-no adenopathy palpable Musculoskeletal-no joint tenderness, deformity or swelling Skin-warm and dry, no hyperpigmentation, vitiligo, or suspicious lesions  Neurological Examination   Mental Status: Alert, oriented, thought content appropriate.  Speech fluent without evidence of aphasia.  Able to follow 3 step commands without difficulty. Cranial Nerves: II: Discs flat bilaterally; Visual fields grossly normal, pupils equal, round, reactive to light and accommodation III,IV, VI: ptosis not present, extra-ocular motions intact bilaterally V,VII: smile symmetric, facial light touch sensation normal bilaterally VIII: hearing normal bilaterally IX,X: gag reflex present XI: bilateral shoulder shrug XII: midline tongue extension Motor: Right : Upper extremity   5-/5    Left:     Upper extremity   5-/5  Lower extremity   5-/5     Lower extremity   4+/5 Tone and bulk:normal tone throughout; no atrophy noted Sensory: Pinprick and light touch intact throughout, bilaterally Deep Tendon Reflexes: 2+ and symmetric with absent AJ's bilaterally Plantars: Right: mute   Left: mute Cerebellar: Normal finger-to-nose and normal heel-to-shin testing bilaterally Gait: not tested due to safety concerns    Laboratory Studies:  Basic Metabolic Panel:  Recent Labs Lab 08/14/16 1207  NA 141  K 3.3*  CL 105  CO2 26  GLUCOSE 126*  BUN 11  CREATININE 0.45  CALCIUM 9.8    Liver Function Tests:  Recent Labs Lab 08/14/16 1207  AST 27  ALT 20  ALKPHOS 51  BILITOT 0.9  PROT 7.8  ALBUMIN 4.5   No results for input(s): LIPASE, AMYLASE in the last 168 hours. No results for input(s): AMMONIA in the last 168 hours.  CBC:  Recent Labs Lab 08/14/16 1207  WBC 7.8  NEUTROABS 4.7  HGB 13.7  HCT 39.6  MCV 94.4  PLT 266    Cardiac Enzymes:  Recent Labs Lab 08/14/16 1207  TROPONINI <0.03     BNP: Invalid input(s): POCBNP  CBG:  Recent Labs Lab 08/14/16 1214  GLUCAP 119*    Microbiology: No results found for this or any previous visit.  Coagulation Studies:  Recent Labs  08/14/16 1207  LABPROT 12.1  INR 0.90    Urinalysis: No results for input(s): COLORURINE, LABSPEC, PHURINE, GLUCOSEU, HGBUR, BILIRUBINUR, KETONESUR, PROTEINUR, UROBILINOGEN, NITRITE, LEUKOCYTESUR in the last 168 hours.  Invalid input(s): APPERANCEUR  Lipid Panel:    Component Value Date/Time   CHOL 151 02/01/2012 0540   TRIG 58 02/01/2012 0540   HDL 94 (H) 02/01/2012 0540   VLDL 12 02/01/2012 0540   LDLCALC 45 02/01/2012 0540    HgbA1C: No results found for: HGBA1C  Urine Drug Screen:  No results found for: LABOPIA, COCAINSCRNUR, LABBENZ, AMPHETMU, THCU, LABBARB  Alcohol Level: No results for input(s): ETH in the last 168 hours.  Other results: EKG: sinus tachycardia at 104 bpm.  Imaging: Ct Head Code Stroke W/o Cm  Result Date: 08/14/2016 CLINICAL DATA:  Code stroke. 61 year old female left side weakness and difficulty swallowing this morning. EXAM: CT HEAD WITHOUT CONTRAST TECHNIQUE: Contiguous axial images were obtained from the base of the skull through the vertex without intravenous contrast. COMPARISON:  Brain MRI 02/01/2012, head CT 01/31/2012 FINDINGS: Brain: Cerebral volume is  stable and within normal limits. No midline shift, ventriculomegaly, mass effect, evidence of mass lesion, intracranial hemorrhage or evidence of cortically based acute infarction. Gray-white matter differentiation is within normal limits throughout the brain. Vascular: No suspicious intracranial vascular hyperdensity. Skull: No acute osseous abnormality identified. Sinuses/Orbits: Chronic scattered ethmoid mucosal thickening. Other visible paranasal sinuses and mastoids are stable and well pneumatized. Other: Visualized orbits and scalp soft tissues are within normal limits. ASPECTS (Alberta Stroke  Program Early CT Score) - Ganglionic level infarction (caudate, lentiform nuclei, internal capsule, insula, M1-M3 cortex): 7 - Supraganglionic infarction (M4-M6 cortex): 3 Total score (0-10 with 10 being normal): 10 IMPRESSION: 1. Stable since 2014 and normal noncontrast CT appearance of the brain. 2. ASPECTS is 10. 3. Study discussed by telephone with Dr. Jene EveryOBERT KINNER on 08/14/2016 at 12:16 . Electronically Signed   By: Odessa FlemingH  Hall M.D.   On: 08/14/2016 12:16    Assessment: 61 y.o. female with a history of MD presenting with left sided weakness.  Current NIHSS of 0 despite patient reporting that her symptoms continue.  Head CT reviewed and shows no acute changes.  Although this presentation may be related to her MD will further rule out the possibility of ischemic etiology.    Stroke Risk Factors - hyperlipidemia and hypertension  Plan: 1. MRI of the brain without contrast. If unremarkable no further work up recommended at this time.  If indicative of an acute event would admit for stroke work up.   2. NPO until RN stroke swallow screen 3. Telemetry monitoring 4. Frequent neuro checks 5.  ASA to be administered in the ED  Case discussed with Dr. Geronimo BootKinner  Kyarra Vancamp, MD Neurology [phone] 08/14/2016, 12:56 PM

## 2016-08-25 ENCOUNTER — Ambulatory Visit (INDEPENDENT_AMBULATORY_CARE_PROVIDER_SITE_OTHER): Payer: Managed Care, Other (non HMO) | Admitting: Primary Care

## 2016-08-25 ENCOUNTER — Encounter: Payer: Self-pay | Admitting: Primary Care

## 2016-08-25 VITALS — BP 122/78 | HR 94 | Temp 97.9°F | Ht 64.0 in | Wt 139.1 lb

## 2016-08-25 DIAGNOSIS — E876 Hypokalemia: Secondary | ICD-10-CM | POA: Diagnosis not present

## 2016-08-25 DIAGNOSIS — G7100 Muscular dystrophy, unspecified: Secondary | ICD-10-CM

## 2016-08-25 DIAGNOSIS — E559 Vitamin D deficiency, unspecified: Secondary | ICD-10-CM

## 2016-08-25 DIAGNOSIS — R531 Weakness: Secondary | ICD-10-CM | POA: Diagnosis not present

## 2016-08-25 LAB — VITAMIN B12: Vitamin B-12: 298 pg/mL (ref 211–911)

## 2016-08-25 LAB — BASIC METABOLIC PANEL
BUN: 13 mg/dL (ref 6–23)
CHLORIDE: 100 meq/L (ref 96–112)
CO2: 28 meq/L (ref 19–32)
CREATININE: 0.44 mg/dL (ref 0.40–1.20)
Calcium: 9.6 mg/dL (ref 8.4–10.5)
GFR: 186.65 mL/min (ref >60.00)
GLUCOSE: 112 mg/dL — AB (ref 70–99)
Potassium: 4.2 mEq/L (ref 3.5–5.1)
Sodium: 137 mEq/L (ref 135–145)

## 2016-08-25 LAB — VITAMIN D 25 HYDROXY (VIT D DEFICIENCY, FRACTURES): VITD: 48.34 ng/mL (ref 30.00–100.00)

## 2016-08-25 NOTE — Assessment & Plan Note (Signed)
Check vitamin D today. 

## 2016-08-25 NOTE — Patient Instructions (Signed)
Complete lab work prior to leaving today. I will notify you of your results once received.   Follow up with Neurology as scheduled.  It was a pleasure to see you today!

## 2016-08-25 NOTE — Assessment & Plan Note (Signed)
Symptoms several weeks ago likely related.  Stroke work up negative. She will follow up with Neurology as recommended.

## 2016-08-25 NOTE — Progress Notes (Signed)
Subjective:    Patient ID: Jeanne Taylor, female    DOB: 12/11/1955, 61 y.o.   MRN: [id number]  HPI  Ms. Jeanne Taylor is a 61 year old female who presents today for emergency department follow up.  [facility]/16 with a chief complaint of left-sided weakness. She has a history of muscular dystrophy and has chronic left sided weakness, but her weakness had become worse than usual.  During her stay in the ED she was evaluated as an acute stroke. She underwent CT head which was unremarkable. She underwent consultation per Neurology who recommended MRI of the brain. MRI of the brain was unremarkable. Labs were overall unremarkable including Troponin, CBC, PT-INR. Her potassium was noted to be low at 3.3. Given the reassuring imaging she was discharged home with recommendations for neurology follow up.  Since her discharge home she's doing well. She still experiences her chronic left sided weakness which has improved. She has an appointment with Neurology on 09/09/16. She's currently taking Vitamin D 5000 units once daily since late May 2018. She denies difficulty with speech, numbness/tingling.  Review of Systems  Constitutional: Negative for fatigue.  Respiratory: Negative for shortness of breath.   Cardiovascular: Negative for chest pain.  Neurological: Positive for weakness. Negative for dizziness, tremors, facial asymmetry and numbness.       Past Medical History:  Diagnosis Date  . Anxiety   . Hyperlipidemia   . Hypertension   . Muscular dystrophy [facility](HCC)      Social History   Social History  . Marital status: Divorced    Spouse name: N/A  . Number of children: N/A  . Years of education: N/A   Occupational History  . Not on file.   Social History Main Topics  . Smoking status: Never Smoker  . Smokeless tobacco: Never Used  . Alcohol use 0.0 oz/week     Comment: social  . Drug use: No  . Sexual activity: Not on file   Other Topics Concern  . Not on file    Social History Narrative   Single.   1 daughter, adopted.   No grandchildren.   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   Lives at home with her Harlow MaresGodson.    Enjoys cooking, cleaning, decorating.     Past Surgical History:  Procedure Laterality Date  . BUNIONECTOMY Right     Family History  Problem Relation Age of Onset  . Colon cancer Mother   . Hypertension Father   . Muscular dystrophy Brother     No Known Allergies  Current Outpatient Prescriptions on File Prior to Visit  Medication Sig Dispense Refill  . amLODipine (NORVASC) 10 MG tablet Take 1 tablet (10 mg total) by mouth daily. 90 tablet 0  . benazepril (LOTENSIN) 20 MG tablet Take 1 tablet (20 mg total) by mouth daily. 90 tablet 0  . cyclobenzaprine (FLEXERIL) 10 MG tablet Take 0.5-1 tablets (5-10 mg total) by mouth at bedtime as needed for muscle spasms. 30 tablet 0  . etodolac (LODINE) 400 MG tablet Take 1 tablet (400 mg total) by mouth 2 (two) times daily as needed. 60 tablet 0   No current facility-administered medications on file prior to visit.     BP 122/78   Pulse 94   Temp 97.9 F (36.6 C) (Oral)   Ht 5\' 4"  (1.626 m)   Wt 139 lb 1.9 oz (63.1 kg)   SpO2 97%   BMI 23.88 kg/m    Objective:  Physical Exam  Constitutional: She is oriented to person, place, and time. She appears well-nourished.  Eyes: EOM are normal.  Neck: Neck supple.  Cardiovascular: Normal rate and regular rhythm.   Pulmonary/Chest: Effort normal and breath sounds normal.  Neurological: She is alert and oriented to person, place, and time. She has normal reflexes. No cranial nerve deficit.  Good balance during ambulation with cane.  Skin: Skin is warm and dry.          Assessment & Plan:  Emergency Department:  [facility] ED for left sided weakness. Stroke work up negative.  Exam today unremarkable. Suspect symptoms to be secondary to muscular dystrophy.  She will establish with Neurology in mid August, strongly  recommended she follow with Neurology for muscular dystrophy.  Check potassium and Vitamin D levels today.  All hospital notes, imaging, labs reviewed.  Morrie Sheldonlark,Myron Stankovich Kendal, NP

## 2016-09-24 ENCOUNTER — Encounter: Payer: Managed Care, Other (non HMO) | Admitting: Primary Care

## 2016-10-01 ENCOUNTER — Encounter: Payer: Self-pay | Admitting: Physical Therapy

## 2016-10-01 ENCOUNTER — Ambulatory Visit: Payer: Managed Care, Other (non HMO) | Attending: Neurology | Admitting: Physical Therapy

## 2016-10-01 DIAGNOSIS — R262 Difficulty in walking, not elsewhere classified: Secondary | ICD-10-CM | POA: Diagnosis present

## 2016-10-01 DIAGNOSIS — M6281 Muscle weakness (generalized): Secondary | ICD-10-CM | POA: Diagnosis not present

## 2016-10-01 NOTE — Therapy (Addendum)
[facility] MAIN [facility] SERVICES [address] [place], [place], [address] Phone: [phone]   Fax:  [phone]  Physical Therapy Evaluation  Patient Details  Name: Jeanne Taylor MRN: [id number] Date of Birth: [birth year] Referring Provider: Morene Crocker   Encounter Date: 10/01/2016      PT End of Session - 10/01/16 1702    Visit Number 1   Number of Visits 17   Date for PT Re-Evaluation 11/26/16   Authorization Type cigna   Authorization Time Period goals assessed 10/29/16   PT Start Time 0445   PT Stop Time 0545   PT Time Calculation (min) 60 min   Equipment Utilized During Treatment Gait belt   Activity Tolerance Patient tolerated treatment well;Patient limited by fatigue   Behavior During Therapy [facility] for tasks assessed/performed      Past Medical History:  Diagnosis Date  . Anxiety   . Hyperlipidemia   . Hypertension   . Muscular dystrophy [facility])     Past Surgical History:  Procedure Laterality Date  . BUNIONECTOMY Right     There were no vitals filed for this visit.       Subjective Assessment - 10/01/16 1649    Subjective Patient feels that her left side is weaker on and off for "a while". She has difficutly with bending over and reaching for items and her walking is unsteady.    Pertinent History Patient states that she was at work and started feeling very weak on her left side. Left work and went to the ER. Got tested for stroke but all tests came back negative. Still feels weak on her left side in upper and lower extremities. Weakness in left leg has caused her to feel off-balance. Has had these symptoms on and off for years; occurs more frequently now.  Current episode has persisted for a month. Had an EMG in 2014.  No pain or other symptoms. Wonders whether symptoms are related to her [facility] muscular dystrophy.  Ambulates with cane for balance when she goes out.  Has had some popping in her back and neck   Limitations  Standing;Walking   How long can you sit comfortably? unlimited   How long can you stand comfortably? 10 - 15 minutes   How long can you walk comfortably? household distances   Patient Stated Goals Patient wants to be able to ambulate longer distances .    Currently in Pain? Yes   Pain Score 2    Pain Location Back   Pain Orientation Left   Pain Descriptors / Indicators Dull   Pain Type Chronic pain   Pain Onset More than a month ago   Pain Frequency Intermittent   Aggravating Factors  sitting   Pain Relieving Factors nothing   Multiple Pain Sites No            [facility] PT Assessment - 10/01/16 1657      Assessment   Medical Diagnosis weakness   Referring Provider Theora Master E    Onset Date/Surgical Date 08/14/16   Hand Dominance Right   Prior Therapy no     Precautions   Precautions Fall     Restrictions   Weight Bearing Restrictions No     Balance Screen   Has the patient fallen in the past 6 months Yes   How many times? --  1   Has the patient had a decrease in activity level because of a fear of falling?  Yes  Is the patient reluctant to leave their home because of a fear of falling?  No     Home Environment   Living Environment Private residence   Living Arrangements Children   Available Help at Discharge Family   Type of Home House   Home Access Stairs to enter   Entrance Stairs-Number of Steps 2   Entrance Stairs-Rails Right   Home Layout One level   Home Equipment Evans - single point     Prior Function   Level of Independence Independent with household mobility with device   Vocation Full time employment   Vocation Requirements sitting   Leisure --  cooking, decorating,      Cognition   Overall Cognitive Status Within Functional Limits for tasks assessed        POSTURE:  WFL   PROM/AROM: PROM is WNL all ext  STRENGTH:  Graded on a 0-5 scale Muscle Group Left Right  Shoulder flex -3/5 -3/5  Shoulder Abd -3/5 -3/5  Shoulder Ext  -4/5 4/5  Shoulder IR/ER nt nt  Elbow -4/5 4/5  Wrist/hand 5/5 5/5  Hip Flex 3/5 3/5  Hip Abd 2/5 2/5  Hip Add 2/5 2/5  Hip Ext 1/5 1/5  Hip IR/ER 3/5 3/5  Knee Flex 5/5 5/5  Knee Ext 5/5 5/5  Ankle DF 5/5 5/5  Ankle PF NT NT   SENSATION: intact all ext's   FUNCTIONAL MOBILITY:  Independent with mobility but difficult supine to  Sit and difficulty with rolling   BALANCE:1 second single leg stand bilaterally;  tandem stand x 5 seconds  Standing Dynamic Balance  Normal Stand independently unsupported, able to weight shift and cross midline maximally   Good Stand independently unsupported, able to weight shift and cross midline moderately   Good-/Fair+ Stand independently unsupported, able to weight shift across midline minimally   Fair Stand independently unsupported, weight shift, and reach ipsilaterally, loss of balance when crossing midline x  Poor+ Able to stand with Min A and reach ipsilaterally, unable to weight shift   Poor Able to stand with Mod A and minimally reach ipsilaterally, unable to cross midline.    Static Standing Balance  Normal Able to maintain standing balance against maximal resistance   Good Able to maintain standing balance against moderate resistance   Good-/Fair+ Able to maintain standing balance against minimal resistance x  Fair Able to stand unsupported without UE support and without LOB for 1-2 min   Fair- Requires Min A and UE support to maintain standing without loss of balance   Poor+ Requires mod A and UE support to maintain standing without loss of balance   Poor Requires max A and UE support to maintain standing balance without loss     GAIT: Patient ambulates with spc short distances with  Increased ER  BLK  OUTCOME MEASURES: TEST Outcome Interpretation  5 times sit<>stand 16.17sec >64 yo, >15 sec indicates increased risk for falls  10 meter walk test   .74              m/s <1.0 m/s indicates increased risk for falls; limited community  ambulator  Timed up and Go  15.75               sec <14 sec indicates increased risk for falls  PT Education - 10/01/16 1701    Education provided Yes   Education Details plan of care   Person(s) Educated Patient   Methods Explanation   Comprehension Verbalized understanding          PT Short Term Goals - 10/01/16 1843      PT SHORT TERM GOAL #1   Title Patient will be independent with HEP to improve strength/mobility for better functional independence with ADL's   Time 4   Period Weeks   Status New   Target Date 10/22/16     PT SHORT TERM GOAL #2   Title Patient will perform bed mobility from supine to sit with less effort by using her UE's to assist  and not rolling prone to drop her legs to the floor.   Baseline Patient rolls to prone and drops her feet to the floor and pushes up with her UE's   Time 4   Period Weeks   Status New   Target Date 10/22/16           PT Long Term Goals - 10/01/16 1835      PT LONG TERM GOAL #1   Title Patient will increase BLE gross strength to 4+/5 as to improve functional strength for independent gait, increased standing tolerance and increased ADL ability.   Time 8   Period Weeks   Status New   Target Date 11/26/16     PT LONG TERM GOAL #2   Title Patient will increase 10 meter walk test to >1.8745m/s as to improve gait speed for better community ambulation and to reduce fall risk.   Time 8   Period Weeks   Status New   Target Date 11/26/16     PT LONG TERM GOAL #3   Title Patient (> 61 years old) will complete five times sit to stand test in < 15 seconds indicating an increased LE strength and improved balance.   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 She has weakness in proximal muscles BLE and BUE. She transfers from sit ot stand with spc. She has decreased gait speed and her outcome measures indicate a falls risk. She has difficultly with bed mobility and is slower but independent. Patient will benefit from skilled PT to improve safey and gait and quality of life.    Clinical Presentation Evolving   Clinical Presentation due to: patient has had a fall    Clinical Decision Making Moderate   Rehab Potential Good   PT Frequency 2x / week   PT Duration 8 weeks   PT Treatment/Interventions Gait training;Functional mobility training;Neuromuscular re-education;Balance training;Therapeutic exercise;Therapeutic activities;Patient/family education   PT Next Visit Plan strengthening and balance training   Consulted and Agree with Plan of Care Patient      Patient will benefit from skilled therapeutic intervention in order to improve the following deficits and impairments:  Abnormal gait, Difficulty walking, Postural dysfunction, Decreased strength, Decreased activity tolerance, Decreased mobility, Decreased balance, Decreased endurance  Visit Diagnosis: Muscle weakness (  generalized) - Plan: PT plan of care cert/re-cert  Difficulty in walking, not elsewhere classified - Plan: PT plan of care cert/re-cert     Problem List Patient Active Problem List   Diagnosis Date Noted  . Back pain 06/19/2016  . Vitamin D deficiency 06/19/2016  . Family history of colon cancer 09/21/2015  . Hypertension 07/03/2015  . Allergic rhinitis 07/03/2015  . Muscular dystrophy (HCC) 07/03/2015    Jones Broom S,PT DPT 10/01/2016, 7:04 PM  [facility] MAIN [facility] SERVICES [address] [place], [place], [address] Phone: [phone]   Fax:  [phone]  Name: Jeanne Taylor MRN: [id number] Date of Birth:  1955-09-12

## 2016-10-06 ENCOUNTER — Ambulatory Visit: Payer: Managed Care, Other (non HMO) | Admitting: Physical Therapy

## 2016-10-08 ENCOUNTER — Ambulatory Visit: Payer: Managed Care, Other (non HMO) | Admitting: Physical Therapy

## 2016-10-13 ENCOUNTER — Ambulatory Visit: Payer: Managed Care, Other (non HMO) | Admitting: Physical Therapy

## 2016-10-15 ENCOUNTER — Ambulatory Visit: Payer: Managed Care, Other (non HMO) | Admitting: Physical Therapy

## 2016-10-20 ENCOUNTER — Ambulatory Visit: Payer: Managed Care, Other (non HMO) | Admitting: Physical Therapy

## 2016-10-22 ENCOUNTER — Ambulatory Visit: Payer: Managed Care, Other (non HMO) | Admitting: Physical Therapy

## 2016-10-23 ENCOUNTER — Other Ambulatory Visit: Payer: Self-pay | Admitting: Primary Care

## 2016-10-23 DIAGNOSIS — I10 Essential (primary) hypertension: Secondary | ICD-10-CM

## 2016-10-23 NOTE — Telephone Encounter (Signed)
Last OV f/u 08/2015. Last seen 07/2016- acute and pt cancelled 08/2016 CPE. pls advise

## 2016-10-23 NOTE — Telephone Encounter (Signed)
Please remind patient that she is due for her CPE, need to do exam and check labs, please reschedule this at her convenience.

## 2016-10-24 ENCOUNTER — Other Ambulatory Visit: Payer: Self-pay | Admitting: *Deleted

## 2016-10-24 MED ORDER — BENAZEPRIL HCL 20 MG PO TABS: 20.0000 mg | ORAL_TABLET | Freq: Every day | ORAL | 0 refills | Status: DC

## 2016-10-24 NOTE — Telephone Encounter (Signed)
Patient advised.

## 2016-10-27 ENCOUNTER — Ambulatory Visit: Payer: Managed Care, Other (non HMO) | Admitting: Physical Therapy

## 2016-10-28 ENCOUNTER — Other Ambulatory Visit: Payer: Self-pay | Admitting: Primary Care

## 2016-10-28 DIAGNOSIS — I10 Essential (primary) hypertension: Secondary | ICD-10-CM

## 2016-10-29 ENCOUNTER — Ambulatory Visit: Payer: Managed Care, Other (non HMO) | Admitting: Physical Therapy

## 2016-11-05 ENCOUNTER — Ambulatory Visit: Payer: Managed Care, Other (non HMO) | Admitting: Physical Therapy

## 2017-01-26 ENCOUNTER — Other Ambulatory Visit: Payer: Self-pay | Admitting: Primary Care

## 2017-01-26 DIAGNOSIS — I10 Essential (primary) hypertension: Secondary | ICD-10-CM

## 2017-01-26 MED ORDER — BENAZEPRIL HCL 20 MG PO TABS: 20.0000 mg | ORAL_TABLET | Freq: Every day | ORAL | 0 refills | Status: DC

## 2017-01-26 MED ORDER — AMLODIPINE BESYLATE 10 MG PO TABS: 10.0000 mg | ORAL_TABLET | Freq: Every day | ORAL | 0 refills | Status: DC

## 2017-01-26 NOTE — Addendum Note (Signed)
Addended by: Tawnya CrookSAMBATH, Omarr Hann on: 01/26/2017 12:58 PM   Modules accepted: Orders

## 2017-02-09 ENCOUNTER — Encounter: Payer: Self-pay | Admitting: Primary Care

## 2017-02-09 ENCOUNTER — Ambulatory Visit: Payer: 59 | Admitting: Primary Care

## 2017-02-09 VITALS — BP 120/84 | HR 116 | Temp 98.2°F | Wt 146.0 lb

## 2017-02-09 DIAGNOSIS — M545 Low back pain: Secondary | ICD-10-CM | POA: Diagnosis not present

## 2017-02-09 DIAGNOSIS — I10 Essential (primary) hypertension: Secondary | ICD-10-CM

## 2017-02-09 DIAGNOSIS — G8929 Other chronic pain: Secondary | ICD-10-CM

## 2017-02-09 MED ORDER — CYCLOBENZAPRINE HCL 10 MG PO TABS
5.0000 mg | ORAL_TABLET | Freq: Every evening | ORAL | 0 refills | Status: DC | PRN
Start: 2017-02-09 — End: 2017-03-08

## 2017-02-09 MED ORDER — ETODOLAC 400 MG PO TABS: 400.0000 mg | ORAL_TABLET | Freq: Two times a day (BID) | ORAL | 0 refills | Status: DC | PRN

## 2017-02-09 NOTE — Assessment & Plan Note (Signed)
Stable in the office today, continue amlodipine 10 mg and benazepril 20 mg. BMP due in July 2019.

## 2017-02-09 NOTE — Assessment & Plan Note (Signed)
Using etodolac and cyclobenzaprine sparingly as needed, refills sent to pharmacy.

## 2017-02-09 NOTE — Patient Instructions (Signed)
I sent refills for cyclobenzaprine and etodolac medications to your pharmacy.  Please schedule a physical with me in 6 months. You may also schedule a lab only appointment 3-4 days prior. We will discuss your lab results in detail during your physical.  It was a pleasure to see you today!

## 2017-02-09 NOTE — Progress Notes (Signed)
Subjective:    Patient ID: Jeanne Taylor, female    DOB: 11/10/1955, 62 y.o.   MRN: [id number]  HPI  Jeanne Taylor is a 62 year old female who presents today for follow up.Marland Kitchen.   1) Essential Hypertension: Currently managed on amlodipine 10 mg once daily and benazepril 20 mg once daily. She will have her BP checked at work sporadically and will get readings of 120's/80's.  She denies chest pain, dizziness, shortness of breath.   BP Readings from Last 3 Encounters:  02/09/17 120/84  08/25/16 122/78  08/14/16 133/87   2) Low back and hip pain: Chronic for years. History of muscular dystrophy. She is currently managed on cyclobenzaprine and etodolac for which she takes once monthly on average. She is needing refills on both medications. She did see Neurology last year and was set up for physical therapy. She completed once session of physical therapy as she couldn't get appointments to line up with her work schedule. Overall she's about the same, no wore and no better. She feels she's stable.   Review of Systems  Eyes: Negative for visual disturbance.  Respiratory: Negative for shortness of breath.   Cardiovascular: Negative for chest pain.  Neurological: Negative for dizziness and headaches.       Past Medical History:  Diagnosis Date  . Anxiety   . Hyperlipidemia   . Hypertension   . Muscular dystrophy      Social History   Socioeconomic History  . Marital status: Divorced    Spouse name: Not on file  . Number of children: Not on file  . Years of education: Not on file  . Highest education level: Not on file  Social Needs  . Financial resource strain: Not on file  . Food insecurity - worry: Not on file  . Food insecurity - inability: Not on file  . Transportation needs - medical: Not on file  . Transportation needs - non-medical: Not on file  Occupational History  . Not on file  Tobacco Use  . Smoking status: Never Smoker  . Smokeless tobacco: Never Used  Substance and  Sexual Activity  . Alcohol use: Yes    Alcohol/week: 0.0 oz    Comment: social  . Drug use: No  . Sexual activity: Not on file  Other Topics Concern  . Not on file  Social History Narrative   Single.   1 daughter, adopted.   No grandchildren.   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   Lives at home with her Harlow MaresGodson.    Enjoys cooking, cleaning, decorating.     Past Surgical History:  Procedure Laterality Date  . BUNIONECTOMY Right     Family History  Problem Relation Age of Onset  . Colon cancer Mother   . Hypertension Father   . Muscular dystrophy Brother     No Known Allergies  Current Outpatient Medications on File Prior to Visit  Medication Sig Dispense Refill  . amLODipine (NORVASC) 10 MG tablet Take 1 tablet (10 mg total) by mouth daily. 90 tablet 0  . benazepril (LOTENSIN) 20 MG tablet Take 1 tablet (20 mg total) by mouth daily. 90 tablet 0   No current facility-administered medications on file prior to visit.     BP 120/84   Pulse (!) 116   Temp 98.2 F (36.8 C) (Oral)   Wt 146 lb (66.2 kg)   BMI 25.06 kg/m    Objective:   Physical Exam  Constitutional: She appears well-nourished.  Neck:  Neck supple.  Cardiovascular: Normal rate and regular rhythm.  Pulmonary/Chest: Effort normal and breath sounds normal.  Musculoskeletal:  Gait steady with cane.  Skin: Skin is warm and dry.          Assessment & Plan:

## 2017-03-08 ENCOUNTER — Other Ambulatory Visit: Payer: Self-pay | Admitting: Primary Care

## 2017-03-08 DIAGNOSIS — G8929 Other chronic pain: Secondary | ICD-10-CM

## 2017-03-08 DIAGNOSIS — M545 Low back pain: Principal | ICD-10-CM

## 2017-03-09 NOTE — Telephone Encounter (Signed)
Refill sent to pharmacy.   

## 2017-03-09 NOTE — Telephone Encounter (Signed)
Ok to refill? Electronically refill request for cyclobenzaprine (FLEXERIL) 10 MG tablet  Last prescribed on and seen on 02/09/2017

## 2017-03-21 DIAGNOSIS — J069 Acute upper respiratory infection, unspecified: Secondary | ICD-10-CM | POA: Diagnosis not present

## 2017-03-24 DIAGNOSIS — J0100 Acute maxillary sinusitis, unspecified: Secondary | ICD-10-CM | POA: Diagnosis not present

## 2017-04-15 ENCOUNTER — Telehealth: Payer: Self-pay | Admitting: Primary Care

## 2017-04-15 ENCOUNTER — Encounter: Payer: Self-pay | Admitting: Primary Care

## 2017-04-15 NOTE — Telephone Encounter (Signed)
Letter printed and placed in Jeanne Taylor's inbox. She'll need to write in her Juror number on the letter.

## 2017-04-15 NOTE — Telephone Encounter (Signed)
Copied from CRM [id number]. Topic: Quick Communication - See Telephone Encounter >> Apr 15, 2017  8:57 AM Cipriano BunkerLambe, Annette S wrote: CRM for notification. See Telephone encounter for:   Pt. Called saying she has jury duty on April 25th, she is asking for a doctor's note excusing her as her health, she does not get around good enough to go there.    The doctor's not must be received by April 18th, so she would need by April 12 to get it to them.    04/15/17.

## 2017-04-16 NOTE — Telephone Encounter (Signed)
Spoken and notified patient of Kate's comments. Left the letter in the front office and ready for pick up.

## 2017-05-14 ENCOUNTER — Other Ambulatory Visit: Payer: Self-pay | Admitting: Primary Care

## 2017-05-15 ENCOUNTER — Other Ambulatory Visit: Payer: Self-pay | Admitting: Primary Care

## 2017-05-15 DIAGNOSIS — I10 Essential (primary) hypertension: Secondary | ICD-10-CM

## 2017-05-20 DIAGNOSIS — M62830 Muscle spasm of back: Secondary | ICD-10-CM | POA: Diagnosis not present

## 2017-05-20 DIAGNOSIS — M545 Low back pain: Secondary | ICD-10-CM | POA: Diagnosis not present

## 2017-05-22 DIAGNOSIS — R42 Dizziness and giddiness: Secondary | ICD-10-CM | POA: Diagnosis not present

## 2017-05-22 DIAGNOSIS — M545 Low back pain: Secondary | ICD-10-CM | POA: Diagnosis not present

## 2017-07-16 ENCOUNTER — Other Ambulatory Visit: Payer: Self-pay | Admitting: Primary Care

## 2017-07-16 DIAGNOSIS — Z1159 Encounter for screening for other viral diseases: Secondary | ICD-10-CM

## 2017-07-16 DIAGNOSIS — I10 Essential (primary) hypertension: Secondary | ICD-10-CM

## 2017-07-16 DIAGNOSIS — E559 Vitamin D deficiency, unspecified: Secondary | ICD-10-CM

## 2017-08-07 ENCOUNTER — Other Ambulatory Visit (INDEPENDENT_AMBULATORY_CARE_PROVIDER_SITE_OTHER): Payer: 59

## 2017-08-07 DIAGNOSIS — I10 Essential (primary) hypertension: Secondary | ICD-10-CM

## 2017-08-07 DIAGNOSIS — E559 Vitamin D deficiency, unspecified: Secondary | ICD-10-CM

## 2017-08-07 DIAGNOSIS — Z1159 Encounter for screening for other viral diseases: Secondary | ICD-10-CM

## 2017-08-08 LAB — LIPID PANEL
CHOL/HDL RATIO: 2.1 (calc) (ref <5.0)
Cholesterol: 171 mg/dL (ref <200)
HDL: 81 mg/dL (ref >50)
LDL Cholesterol (Calc): 77 mg/dL (calc)
NON-HDL CHOLESTEROL (CALC): 90 mg/dL (ref <130)
Triglycerides: 57 mg/dL (ref <150)

## 2017-08-08 LAB — COMPREHENSIVE METABOLIC PANEL
AG Ratio: 1.9 (calc) (ref 1.0–2.5)
ALBUMIN MSPROF: 4.5 g/dL (ref 3.6–5.1)
ALT: 17 U/L (ref 6–29)
AST: 18 U/L (ref 10–35)
Alkaline phosphatase (APISO): 52 U/L (ref 33–130)
BUN / CREAT RATIO: 26 (calc) — AB (ref 6–22)
BUN: 10 mg/dL (ref 7–25)
CO2: 27 mmol/L (ref 20–32)
CREATININE: 0.39 mg/dL — AB (ref 0.50–0.99)
Calcium: 9.9 mg/dL (ref 8.6–10.4)
Chloride: 101 mmol/L (ref 98–110)
GLOBULIN: 2.4 g/dL (ref 1.9–3.7)
GLUCOSE: 111 mg/dL — AB (ref 65–99)
POTASSIUM: 3.9 mmol/L (ref 3.5–5.3)
Sodium: 140 mmol/L (ref 135–146)
TOTAL PROTEIN: 6.9 g/dL (ref 6.1–8.1)
Total Bilirubin: 1 mg/dL (ref 0.2–1.2)

## 2017-08-08 LAB — HEPATITIS C ANTIBODY
Hepatitis C Ab: NONREACTIVE
SIGNAL TO CUT-OFF: 0.03 (ref <1.00)

## 2017-08-08 LAB — VITAMIN D 25 HYDROXY (VIT D DEFICIENCY, FRACTURES): Vit D, 25-Hydroxy: 65 ng/mL (ref 30–100)

## 2017-08-12 ENCOUNTER — Ambulatory Visit (INDEPENDENT_AMBULATORY_CARE_PROVIDER_SITE_OTHER): Payer: 59 | Admitting: Primary Care

## 2017-08-12 ENCOUNTER — Encounter: Payer: Self-pay | Admitting: Primary Care

## 2017-08-12 VITALS — BP 122/76 | HR 97 | Temp 98.1°F | Ht 64.0 in | Wt 142.8 lb

## 2017-08-12 DIAGNOSIS — G8929 Other chronic pain: Secondary | ICD-10-CM

## 2017-08-12 DIAGNOSIS — M545 Low back pain: Secondary | ICD-10-CM

## 2017-08-12 DIAGNOSIS — E559 Vitamin D deficiency, unspecified: Secondary | ICD-10-CM

## 2017-08-12 DIAGNOSIS — E2839 Other primary ovarian failure: Secondary | ICD-10-CM | POA: Diagnosis not present

## 2017-08-12 DIAGNOSIS — Z23 Encounter for immunization: Secondary | ICD-10-CM

## 2017-08-12 DIAGNOSIS — Z1239 Encounter for other screening for malignant neoplasm of breast: Secondary | ICD-10-CM

## 2017-08-12 DIAGNOSIS — Z800 Family history of malignant neoplasm of digestive organs: Secondary | ICD-10-CM | POA: Diagnosis not present

## 2017-08-12 DIAGNOSIS — Z0000 Encounter for general adult medical examination without abnormal findings: Secondary | ICD-10-CM | POA: Diagnosis not present

## 2017-08-12 DIAGNOSIS — Z0001 Encounter for general adult medical examination with abnormal findings: Secondary | ICD-10-CM | POA: Insufficient documentation

## 2017-08-12 DIAGNOSIS — G7100 Muscular dystrophy, unspecified: Secondary | ICD-10-CM

## 2017-08-12 DIAGNOSIS — I10 Essential (primary) hypertension: Secondary | ICD-10-CM

## 2017-08-12 DIAGNOSIS — Z1231 Encounter for screening mammogram for malignant neoplasm of breast: Secondary | ICD-10-CM

## 2017-08-12 DIAGNOSIS — R739 Hyperglycemia, unspecified: Secondary | ICD-10-CM

## 2017-08-12 LAB — POCT GLYCOSYLATED HEMOGLOBIN (HGB A1C): HEMOGLOBIN A1C: 5.5 % (ref 4.0–5.6)

## 2017-08-12 MED ORDER — ZOSTER VAC RECOMB ADJUVANTED 50 MCG/0.5ML IM SUSR: 0.5000 mL | Freq: Once | INTRAMUSCULAR | 1 refills | Status: AC

## 2017-08-12 NOTE — Assessment & Plan Note (Addendum)
[facility] who recommended physical therapy which was not convenient for her schedule.  Did fall and stumble 2 weeks ago, otherwise has been stable.  Using Flexeril and etodolac sparingly.

## 2017-08-12 NOTE — Assessment & Plan Note (Signed)
Doing well on Vitamin D 5000 units daily, continue same. Bone density scan pending.

## 2017-08-12 NOTE — Addendum Note (Signed)
Addended by: Tawnya CrookSAMBATH, Rhys Lichty on: 08/12/2017 04:30 PM   Modules accepted: Orders

## 2017-08-12 NOTE — Patient Instructions (Addendum)
Continue to work on your diet, eat plenty of vegetables, fruit, whole grains, lean protein.  Ensure you are consuming 64 ounces of water daily.  Continue regular walking.   Call the [facility] for your bone density and mammogram.   We will call you in 2 weeks to ask about the colonoscopy.  Please return at your convenience for the pap smear.  Take the shingles vaccination to your pharmacy as discussed.   Follow up in 1 year for your annual exam or sooner if needed.  It was a pleasure to see you today!   Preventive Care 40-64 Years, Female Preventive care refers to lifestyle choices and visits with your health care provider that can promote health and wellness. What does preventive care include?  A yearly physical exam. This is also called an annual well check.  Dental exams once or twice a year.  Routine eye exams. Ask your health care provider how often you should have your eyes checked.  Personal lifestyle choices, including: ? Daily care of your teeth and gums. ? Regular physical activity. ? Eating a healthy diet. ? Avoiding tobacco and drug use. ? Limiting alcohol use. ? Practicing safe sex. ? Taking low-dose aspirin daily starting at age 12. ? Taking vitamin and mineral supplements as recommended by your health care provider. What happens during an annual well check? The services and screenings done by your health care provider during your annual well check will depend on your age, overall health, lifestyle risk factors, and family history of disease. Counseling Your health care provider may ask you questions about your:  Alcohol use.  Tobacco use.  Drug use.  Emotional well-being.  Home and relationship well-being.  Sexual activity.  Eating habits.  Work and work Statistician.  Method of birth control.  Menstrual cycle.  Pregnancy history.  Screening You may have the following tests or measurements:  Height, weight, and  BMI.  Blood pressure.  Lipid and cholesterol levels. These may be checked every 5 years, or more frequently if you are over 39 years old.  Skin check.  Lung cancer screening. You may have this screening every year starting at age 60 if you have a 30-pack-year history of smoking and currently smoke or have quit within the past 15 years.  Fecal occult blood test (FOBT) of the stool. You may have this test every year starting at age 89.  Flexible sigmoidoscopy or colonoscopy. You may have a sigmoidoscopy every 5 years or a colonoscopy every 10 years starting at age 44.  Hepatitis C blood test.  Hepatitis B blood test.  Sexually transmitted disease (STD) testing.  Diabetes screening. This is done by checking your blood sugar (glucose) after you have not eaten for a while (fasting). You may have this done every 1-3 years.  Mammogram. This may be done every 1-2 years. Talk to your health care provider about when you should start having regular mammograms. This may depend on whether you have a family history of breast cancer.  BRCA-related cancer screening. This may be done if you have a family history of breast, ovarian, tubal, or peritoneal cancers.  Pelvic exam and Pap test. This may be done every 3 years starting at age 67. Starting at age 47, this may be done every 5 years if you have a Pap test in combination with an HPV test.  Bone density scan. This is done to screen for osteoporosis. You may have this scan if you are at high risk for  osteoporosis.  Discuss your test results, treatment options, and if necessary, the need for more tests with your health care provider. Vaccines Your health care provider may recommend certain vaccines, such as:  Influenza vaccine. This is recommended every year.  Tetanus, diphtheria, and acellular pertussis (Tdap, Td) vaccine. You may need a Td booster every 10 years.  Varicella vaccine. You may need this if you have not been vaccinated.  Zoster  vaccine. You may need this after age 64.  Measles, mumps, and rubella (MMR) vaccine. You may need at least one dose of MMR if you were born in 1957 or later. You may also need a second dose.  Pneumococcal 13-valent conjugate (PCV13) vaccine. You may need this if you have certain conditions and were not previously vaccinated.  Pneumococcal polysaccharide (PPSV23) vaccine. You may need one or two doses if you smoke cigarettes or if you have certain conditions.  Meningococcal vaccine. You may need this if you have certain conditions.  Hepatitis A vaccine. You may need this if you have certain conditions or if you travel or work in places where you may be exposed to hepatitis A.  Hepatitis B vaccine. You may need this if you have certain conditions or if you travel or work in places where you may be exposed to hepatitis B.  Haemophilus influenzae type b (Hib) vaccine. You may need this if you have certain conditions.  Talk to your health care provider about which screenings and vaccines you need and how often you need them. This information is not intended to replace advice given to you by your health care provider. Make sure you discuss any questions you have with your health care provider. Document Released: 02/09/2015 Document Revised: 10/03/2015 Document Reviewed: 11/14/2014 Elsevier Interactive Patient Education  [employer].

## 2017-08-12 NOTE — Assessment & Plan Note (Signed)
Stable in the office today, continue current regimen. BMP unremarkable.  

## 2017-08-12 NOTE — Assessment & Plan Note (Addendum)
Declines colonoscopy today, states she will think about it. Strongly recommended given family history. Did offer cologuard for which she will think about as well. Will call her in 2 weeks.

## 2017-08-12 NOTE — Assessment & Plan Note (Addendum)
Tetanus due, provided today. Rx for Shingrix provided. Pap smear due, she kindly declines today and will return at a later date. Mammogram and bone density due, pending. Colon cancer screening due, she declines colonoscopy despite strong recommendations given family history. We will call back in 2 weeks for a decision.  Recommended to increase vegetables, fruit, whole grains, lean protein. Recommended continued walking/exercise. Exam unremarkable. Labs reviewed. Follow up in 1 year for CPE.

## 2017-08-12 NOTE — Progress Notes (Signed)
Subjective:    Patient ID: Jeanne Taylor, female    DOB: 05/01/1955, 62 y.o.   MRN: [id number]  HPI  Ms. Jeanne Taylor is a 62 year old female who presents today for complete physical. She has no complaints today.  Immunizations: -Tetanus: Unsure, believes it's over 10 years.  -Influenza: Completed last season -Shingles: Never completed  Diet: She endorses a healthy diet Breakfast: Skips during weekdays, weekends eggs, bacon Lunch: Chicken, salad, hamburger Dinner: Vegetables, meat, potatoes Snacks: Occasionally chips Desserts: Candy, once weekly  Beverages: Water, regular soda  Exercise: She is walking some Eye exam: Completed in 2019 Dental exam: Completes semi-annually  Colonoscopy: Never completed, opts for Cologuard Dexa: Completed years ago, due Pap Smear: Completed 3-4 years ago. Declines today. Mammogram: Completed 3-4 years ago. Hep C Screen: Negative  BP Readings from Last 3 Encounters:  08/12/17 122/76  02/09/17 120/84  08/25/16 122/78     Review of Systems  Constitutional: Negative for unexpected weight change.  HENT: Negative for rhinorrhea.   Respiratory: Negative for cough and shortness of breath.   Cardiovascular: Negative for chest pain.  Gastrointestinal: Negative for constipation and diarrhea.  Genitourinary: Negative for difficulty urinating and menstrual problem.  Musculoskeletal:       Intermittent myalgias and back pain  Skin: Negative for rash.  Allergic/Immunologic: Negative for environmental allergies.  Neurological: Negative for dizziness, numbness and headaches.  Psychiatric/Behavioral: The patient is not nervous/anxious.        Past Medical History:  Diagnosis Date  . Anxiety   . Hyperlipidemia   . Hypertension   . Muscular dystrophy (HCC)      Social History   Socioeconomic History  . Marital status: Divorced    Spouse name: Not on file  . Number of children: Not on file  . Years of education: Not on file  . Highest education  level: Not on file  Occupational History  . Not on file  Social Needs  . Financial resource strain: Not on file  . Food insecurity:    Worry: Not on file    Inability: Not on file  . Transportation needs:    Medical: Not on file    Non-medical: Not on file  Tobacco Use  . Smoking status: Never Smoker  . Smokeless tobacco: Never Used  Substance and Sexual Activity  . Alcohol use: Yes    Alcohol/week: 0.0 oz    Comment: social  . Drug use: No  . Sexual activity: Not on file  Lifestyle  . Physical activity:    Days per week: Not on file    Minutes per session: Not on file  . Stress: Not on file  Relationships  . Social connections:    Talks on phone: Not on file    Gets together: Not on file    Attends religious service: Not on file    Active member of club or organization: Not on file    Attends meetings of clubs or organizations: Not on file    Relationship status: Not on file  . Intimate partner violence:    Fear of current or ex partner: Not on file    Emotionally abused: Not on file    Physically abused: Not on file    Forced sexual activity: Not on file  Other Topics Concern  . Not on file  Social History Narrative   Single.   1 daughter, adopted.   No grandchildren.   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   Lives at home  with her Harlow Mares.    Enjoys cooking, cleaning, decorating.     Past Surgical History:  Procedure Laterality Date  . BUNIONECTOMY Right     Family History  Problem Relation Age of Onset  . Colon cancer Mother   . Hypertension Father   . Muscular dystrophy Brother     No Known Allergies  Current Outpatient Medications on File Prior to Visit  Medication Sig Dispense Refill  . amLODipine (NORVASC) 10 MG tablet TAKE 1 TABLET BY MOUTH EVERY DAY 90 tablet 1  . benazepril (LOTENSIN) 20 MG tablet TAKE 1 TABLET BY MOUTH EVERY DAY 90 tablet 1  . cyclobenzaprine (FLEXERIL) 10 MG tablet TAKE 1/2 TO 1 TABLETS (5-10 MG TOTAL) BY MOUTH AT BEDTIME AS  NEEDED FOR MUSCLE SPASMS. 30 tablet 0  . etodolac (LODINE) 400 MG tablet Take 1 tablet (400 mg total) by mouth 2 (two) times daily as needed. 60 tablet 0   No current facility-administered medications on file prior to visit.     BP 122/76   Pulse 97   Temp 98.1 F (36.7 C) (Oral)   Ht 5\' 4"  (1.626 m)   Wt 142 lb 12 oz (64.8 kg)   SpO2 97%   BMI 24.50 kg/m    Objective:   Physical Exam  Constitutional: She is oriented to person, place, and time. She appears well-nourished.  HENT:  Mouth/Throat: No oropharyngeal exudate.  Eyes: Pupils are equal, round, and reactive to light. EOM are normal.  Neck: Neck supple. No thyromegaly present.  Cardiovascular: Normal rate and regular rhythm.  Respiratory: Effort normal and breath sounds normal.  GI: Soft. Bowel sounds are normal. There is no tenderness.  Musculoskeletal:  Ambulates decently with cane.   Neurological: She is alert and oriented to person, place, and time.  Skin: Skin is warm and dry.  Psychiatric: She has a normal mood and affect.           Assessment & Plan:

## 2017-08-12 NOTE — Assessment & Plan Note (Signed)
Overall stable, using Flexeril and etodolac sparingly.

## 2017-08-26 ENCOUNTER — Telehealth: Payer: Self-pay | Admitting: Primary Care

## 2017-08-26 NOTE — Telephone Encounter (Signed)
Will you please ask patient if she's thought about the colonoscopy? I do recommend it. Let me know.

## 2017-08-27 NOTE — Telephone Encounter (Signed)
Message left for patient to return my call.  

## 2017-08-31 NOTE — Telephone Encounter (Signed)
Message left for patient to return my call.  

## 2017-09-02 NOTE — Telephone Encounter (Signed)
Sending patient a letter regarding Jeanne Taylor's comments. 

## 2017-10-15 ENCOUNTER — Encounter: Payer: Self-pay | Admitting: Primary Care

## 2017-10-15 DIAGNOSIS — Z23 Encounter for immunization: Secondary | ICD-10-CM | POA: Diagnosis not present

## 2017-10-28 ENCOUNTER — Telehealth: Payer: Self-pay | Admitting: Primary Care

## 2017-10-28 NOTE — Telephone Encounter (Signed)
Noted, will cancel

## 2017-10-28 NOTE — Telephone Encounter (Signed)
Jeanne Taylor I have left several message and mail letter and have not received a response from pt.  Is it ok t cancel bone density

## 2017-12-06 ENCOUNTER — Other Ambulatory Visit: Payer: Self-pay | Admitting: Primary Care

## 2017-12-06 DIAGNOSIS — I10 Essential (primary) hypertension: Secondary | ICD-10-CM

## 2018-02-09 DIAGNOSIS — H00014 Hordeolum externum left upper eyelid: Secondary | ICD-10-CM | POA: Diagnosis not present

## 2018-06-18 ENCOUNTER — Other Ambulatory Visit: Payer: Self-pay | Admitting: Primary Care

## 2018-06-18 DIAGNOSIS — I10 Essential (primary) hypertension: Secondary | ICD-10-CM

## 2018-07-19 ENCOUNTER — Other Ambulatory Visit: Payer: Self-pay | Admitting: Primary Care

## 2018-07-19 DIAGNOSIS — I10 Essential (primary) hypertension: Secondary | ICD-10-CM

## 2018-07-21 NOTE — Telephone Encounter (Signed)
Refills have been send as requested. Patient has been notified. Patient will need an appointment in July. Patient stated that she will need to speak to her daughter but will call before refill runs out.

## 2018-07-21 NOTE — Telephone Encounter (Signed)
Patient is calling to check status of refill. 

## 2018-08-15 ENCOUNTER — Other Ambulatory Visit: Payer: Self-pay | Admitting: Primary Care

## 2018-08-15 DIAGNOSIS — I10 Essential (primary) hypertension: Secondary | ICD-10-CM

## 2018-08-19 ENCOUNTER — Telehealth: Payer: Self-pay

## 2018-08-19 ENCOUNTER — Other Ambulatory Visit: Payer: Self-pay | Admitting: Primary Care

## 2018-08-19 DIAGNOSIS — I10 Essential (primary) hypertension: Secondary | ICD-10-CM

## 2018-08-19 DIAGNOSIS — E559 Vitamin D deficiency, unspecified: Secondary | ICD-10-CM

## 2018-08-19 NOTE — Telephone Encounter (Signed)
I recommend she start monitoring her blood pressure once daily around the same time of day and record readings.  Have her bring her readings to her scheduled visit next week. If her dizziness progresses then have her schedule an appointment before then.

## 2018-08-19 NOTE — Telephone Encounter (Signed)
Pt is experiencing dizziness the last 2 days. She checked her BP yesterday during an episode and it was 134/89. 1st episode 2 days ago BP was 140/108and felt nausous.She has had some ringing in her ears that has come and gone.   She has a CPE next week on 08-25-18. Asking if she should come in before that to have this evaluated or if it is ok to wait until then.   Please advise [phone]

## 2018-08-20 NOTE — Telephone Encounter (Signed)
Spoken and notified patient of Kate Clark's comments. Patient verbalized understanding.  

## 2018-08-23 ENCOUNTER — Other Ambulatory Visit (INDEPENDENT_AMBULATORY_CARE_PROVIDER_SITE_OTHER): Payer: 59

## 2018-08-23 DIAGNOSIS — I10 Essential (primary) hypertension: Secondary | ICD-10-CM

## 2018-08-23 DIAGNOSIS — E559 Vitamin D deficiency, unspecified: Secondary | ICD-10-CM

## 2018-08-23 LAB — COMPREHENSIVE METABOLIC PANEL
ALT: 30 U/L (ref 0–35)
AST: 21 U/L (ref 0–37)
Albumin: 4.6 g/dL (ref 3.5–5.2)
Alkaline Phosphatase: 59 U/L (ref 39–117)
BUN: 13 mg/dL (ref 6–23)
CO2: 29 mEq/L (ref 19–32)
Calcium: 10.3 mg/dL (ref 8.4–10.5)
Chloride: 101 mEq/L (ref 96–112)
Creatinine, Ser: 0.5 mg/dL (ref 0.40–1.20)
GFR: 150.55 mL/min (ref >60.00)
Glucose, Bld: 127 mg/dL — ABNORMAL HIGH (ref 70–99)
Potassium: 4.6 mEq/L (ref 3.5–5.1)
Sodium: 140 mEq/L (ref 135–145)
Total Bilirubin: 0.8 mg/dL (ref 0.2–1.2)
Total Protein: 7.4 g/dL (ref 6.0–8.3)

## 2018-08-23 LAB — LIPID PANEL
Cholesterol: 197 mg/dL (ref 0–200)
HDL: 76.2 mg/dL (ref >39.00)
LDL Cholesterol: 102 mg/dL — ABNORMAL HIGH (ref 0–99)
NonHDL: 121.29
Total CHOL/HDL Ratio: 3
Triglycerides: 94 mg/dL (ref 0.0–149.0)
VLDL: 18.8 mg/dL (ref 0.0–40.0)

## 2018-08-23 LAB — VITAMIN D 25 HYDROXY (VIT D DEFICIENCY, FRACTURES): VITD: 67.56 ng/mL (ref 30.00–100.00)

## 2018-08-25 ENCOUNTER — Other Ambulatory Visit: Payer: Self-pay

## 2018-08-25 ENCOUNTER — Ambulatory Visit (INDEPENDENT_AMBULATORY_CARE_PROVIDER_SITE_OTHER): Payer: 59 | Admitting: Primary Care

## 2018-08-25 VITALS — BP 126/86 | HR 92 | Temp 97.8°F | Ht 64.0 in | Wt 156.5 lb

## 2018-08-25 DIAGNOSIS — Z1239 Encounter for other screening for malignant neoplasm of breast: Secondary | ICD-10-CM

## 2018-08-25 DIAGNOSIS — G7100 Muscular dystrophy, unspecified: Secondary | ICD-10-CM | POA: Diagnosis not present

## 2018-08-25 DIAGNOSIS — R42 Dizziness and giddiness: Secondary | ICD-10-CM | POA: Insufficient documentation

## 2018-08-25 DIAGNOSIS — Z1211 Encounter for screening for malignant neoplasm of colon: Secondary | ICD-10-CM

## 2018-08-25 DIAGNOSIS — Z0000 Encounter for general adult medical examination without abnormal findings: Secondary | ICD-10-CM

## 2018-08-25 DIAGNOSIS — Z800 Family history of malignant neoplasm of digestive organs: Secondary | ICD-10-CM | POA: Diagnosis not present

## 2018-08-25 DIAGNOSIS — I10 Essential (primary) hypertension: Secondary | ICD-10-CM | POA: Diagnosis not present

## 2018-08-25 DIAGNOSIS — R739 Hyperglycemia, unspecified: Secondary | ICD-10-CM | POA: Diagnosis not present

## 2018-08-25 MED ORDER — AMLODIPINE BESYLATE 10 MG PO TABS: 10.0000 mg | ORAL_TABLET | Freq: Every day | ORAL | 3 refills | Status: DC

## 2018-08-25 MED ORDER — BENAZEPRIL HCL 20 MG PO TABS: ORAL_TABLET | ORAL | 3 refills | Status: DC

## 2018-08-25 NOTE — Progress Notes (Signed)
Subjective:    Patient ID: Jeanne Taylor, female    DOB: 09/03/1955, 63 y.o.   MRN: [id number]  HPI  Mr. Jeanne Taylor is a 63 year old female who presents today for complete physical. She also reports a chief complaint of dizziness.  Immunizations: -Tetanus: Completed in 2019 -Influenza: Due this season  -Shingles: Never completed   Diet: She endorses a fair diet. She is eating chicken, steak, vegetables, starch. Most eats at home, seldom desserts. Mostly drinking [employer], water.  Exercise: She is not walking  Eye exam: Completed in 2019 Dental exam: Completes semi-annually  Colonoscopy: Never completed Cologuard last year, ready this year.  Pap Smear: No recent pap smear, declines today despite recommendations.  Mammogram: Never completed in 2019 Hep C Screen: Negative in 2019  BP Readings from Last 3 Encounters:  08/25/18 126/86  08/12/17 122/76  02/09/17 120/84     She's been checking her BP at home which is running 110's-140's/70's-100's. She's noticed dizziness for the last two weeks. She was initially sitting at the computer at home, felt "funny and dizzy". Since then she's noticed intermittent dizziness that will occur when resting, sitting at the computer mostly. She did have one incident last week where she felt dizzy when driving, also noticed ringing in the right ear, symptoms quickly resolved.   She denies chest pain, shortness of breath. She endorses drinking some water during the day. She is compliant to her Amlodipine 10 mg and benazepril 20 mg. She has chronic sinus drainage every morning.   Wt Readings from Last 3 Encounters:  08/25/18 156 lb 8 oz (71 kg)  08/12/17 142 lb 12 oz (64.8 kg)  02/09/17 146 lb (66.2 kg)     Review of Systems  Constitutional: Negative for unexpected weight change.  HENT: Negative for rhinorrhea.   Eyes: Negative for visual disturbance.  Respiratory: Negative for cough and shortness of breath.   Cardiovascular: Negative for chest pain.   Gastrointestinal: Negative for diarrhea.       Intermittent constipation  Genitourinary: Negative for difficulty urinating.  Musculoskeletal: Positive for myalgias.       Chronic myalgias with muscular dystrophy   Skin: Negative for rash.  Allergic/Immunologic: Positive for environmental allergies.  Neurological: Positive for light-headedness. Negative for dizziness, numbness and headaches.  Psychiatric/Behavioral: The patient is not nervous/anxious.        Past Medical History:  Diagnosis Date  . Anxiety   . Hyperlipidemia   . Hypertension   . Muscular dystrophy (HCC)      Social History   Socioeconomic History  . Marital status: Divorced    Spouse name: Not on file  . Number of children: Not on file  . Years of education: Not on file  . Highest education level: Not on file  Occupational History  . Not on file  Social Needs  . Financial resource strain: Not on file  . Food insecurity    Worry: Not on file    Inability: Not on file  . Transportation needs    Medical: Not on file    Non-medical: Not on file  Tobacco Use  . Smoking status: Never Smoker  . Smokeless tobacco: Never Used  Substance and Sexual Activity  . Alcohol use: Yes    Alcohol/week: 0.0 standard drinks    Comment: social  . Drug use: No  . Sexual activity: Not on file  Lifestyle  . Physical activity    Days per week: Not on file    Minutes  per session: Not on file  . Stress: Not on file  Relationships  . Social Herbalist on phone: Not on file    Gets together: Not on file    Attends religious service: Not on file    Active member of club or organization: Not on file    Attends meetings of clubs or organizations: Not on file    Relationship status: Not on file  . Intimate partner violence    Fear of current or ex partner: Not on file    Emotionally abused: Not on file    Physically abused: Not on file    Forced sexual activity: Not on file  Other Topics Concern  . Not on  file  Social History Narrative   Single.   1 daughter, adopted.   No grandchildren.   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   Lives at home with her Rosalee Kaufman.    Enjoys cooking, cleaning, decorating.     Past Surgical History:  Procedure Laterality Date  . BUNIONECTOMY Right     Family History  Problem Relation Age of Onset  . Colon cancer Mother   . Hypertension Father   . Muscular dystrophy Brother     No Known Allergies  Current Outpatient Medications on File Prior to Visit  Medication Sig Dispense Refill  . amLODipine (NORVASC) 10 MG tablet TAKE 1 TABLET BY MOUTH EVERY DAY. NEED APPOINTMENT FOR ANY MORE REFILLS 30 tablet 0  . benazepril (LOTENSIN) 20 MG tablet TAKE 1 TABLET BY MOUTH EVERY DAY. NEED APPOINTMENT FOR ANY MORE REFILLS 30 tablet 0  . cyclobenzaprine (FLEXERIL) 10 MG tablet TAKE 1/2 TO 1 TABLETS (5-10 MG TOTAL) BY MOUTH AT BEDTIME AS NEEDED FOR MUSCLE SPASMS. 30 tablet 0  . etodolac (LODINE) 400 MG tablet Take 1 tablet (400 mg total) by mouth 2 (two) times daily as needed. 60 tablet 0   No current facility-administered medications on file prior to visit.     BP (!) 148/94   Pulse 92   Temp 97.8 F (36.6 C) (Temporal)   Ht 5\' 4"  (1.626 m)   Wt 156 lb 8 oz (71 kg)   SpO2 97%   BMI 26.86 kg/m    Objective:   Physical Exam  Constitutional: She is oriented to person, place, and time. She appears well-nourished.  HENT:  Mouth/Throat: No oropharyngeal exudate.  Eyes: Pupils are equal, round, and reactive to light. EOM are normal.  Neck: Neck supple. No thyromegaly present.  Cardiovascular: Normal rate and regular rhythm.  Respiratory: Effort normal and breath sounds normal.  GI: Soft. Bowel sounds are normal. There is no abdominal tenderness.  Musculoskeletal: Normal range of motion.  Neurological: She is alert and oriented to person, place, and time.  Skin: Skin is warm and dry.  Psychiatric: She has a normal mood and affect.           Assessment &  Plan:

## 2018-08-25 NOTE — Patient Instructions (Addendum)
Stop by the lab prior to leaving today. I will notify you of your results once received.   Call the [facility] to schedule your mammogram.   Complete the Cologuard Kit once received.  You will be contacted regarding your referral to neurology.  Please let us know if you have not been contacted within one week.   Continue taking your Amlodipine 10 mg and your Benazepril 20 mg daily for blood pressure.  Start exercising. You should be getting 150 minutes of exercise weekly.  It's important to improve your diet by reducing consumption of fast food, fried food, processed snack foods, sugary drinks. Increase consumption of fresh vegetables and fruits, whole grains, water.  Ensure you are drinking 64 ounces of water daily.  It was a pleasure to see you today!   Preventive Care 49-33 Years Old, Female Preventive care refers to visits with your health care provider and lifestyle choices that can promote health and wellness. This includes:  A yearly physical exam. This may also be called an annual well check.  Regular dental visits and eye exams.  Immunizations.  Screening for certain conditions.  Healthy lifestyle choices, such as eating a healthy diet, getting regular exercise, not using drugs or products that contain nicotine and tobacco, and limiting alcohol use. What can I expect for my preventive care visit? Physical exam Your health care provider will check your:  Height and weight. This may be used to calculate body mass index (BMI), which tells if you are at a healthy weight.  Heart rate and blood pressure.  Skin for abnormal spots. Counseling Your health care provider may ask you questions about your:  Alcohol, tobacco, and drug use.  Emotional well-being.  Home and relationship well-being.  Sexual activity.  Eating habits.  Work and work Statistician.  Method of birth control.  Menstrual cycle.  Pregnancy history. What immunizations do I  need?  Influenza (flu) vaccine  This is recommended every year. Tetanus, diphtheria, and pertussis (Tdap) vaccine  You may need a Td booster every 10 years. Varicella (chickenpox) vaccine  You may need this if you have not been vaccinated. Zoster (shingles) vaccine  You may need this after age 35. Measles, mumps, and rubella (MMR) vaccine  You may need at least one dose of MMR if you were born in [birth year] or later. You may also need a second dose. Pneumococcal conjugate (PCV13) vaccine  You may need this if you have certain conditions and were not previously vaccinated. Pneumococcal polysaccharide (PPSV23) vaccine  You may need one or two doses if you smoke cigarettes or if you have certain conditions. Meningococcal conjugate (MenACWY) vaccine  You may need this if you have certain conditions. Hepatitis A vaccine  You may need this if you have certain conditions or if you travel or work in places where you may be exposed to hepatitis A. Hepatitis B vaccine  You may need this if you have certain conditions or if you travel or work in places where you may be exposed to hepatitis B. Haemophilus influenzae type b (Hib) vaccine  You may need this if you have certain conditions. Human papillomavirus (HPV) vaccine  If recommended by your health care provider, you may need three doses over 6 months. You may receive vaccines as individual doses or as more than one vaccine together in one shot (combination vaccines). Talk with your health care provider about the risks and benefits of combination vaccines. What tests do I need? Blood tests  Lipid  and cholesterol levels. These may be checked every 5 years, or more frequently if you are over 11 years old.  Hepatitis C test.  Hepatitis B test. Screening  Lung cancer screening. You may have this screening every year starting at age 33 if you have a 30-pack-year history of smoking and currently smoke or have quit within the past 15  years.  Colorectal cancer screening. All adults should have this screening starting at age 3 and continuing until age 15. Your health care provider may recommend screening at age 54 if you are at increased risk. You will have tests every 1-10 years, depending on your results and the type of screening test.  Diabetes screening. This is done by checking your blood sugar (glucose) after you have not eaten for a while (fasting). You may have this done every 1-3 years.  Mammogram. This may be done every 1-2 years. Talk with your health care provider about when you should start having regular mammograms. This may depend on whether you have a family history of breast cancer.  BRCA-related cancer screening. This may be done if you have a family history of breast, ovarian, tubal, or peritoneal cancers.  Pelvic exam and Pap test. This may be done every 3 years starting at age 71. Starting at age 98, this may be done every 5 years if you have a Pap test in combination with an HPV test. Other tests  Sexually transmitted disease (STD) testing.  Bone density scan. This is done to screen for osteoporosis. You may have this scan if you are at high risk for osteoporosis. Follow these instructions at home: Eating and drinking  Eat a diet that includes fresh fruits and vegetables, whole grains, lean protein, and low-fat dairy.  Take vitamin and mineral supplements as recommended by your health care provider.  Do not drink alcohol if: ? Your health care provider tells you not to drink. ? You are pregnant, may be pregnant, or are planning to become pregnant.  If you drink alcohol: ? Limit how much you have to 0-1 drink a day. ? Be aware of how much alcohol is in your drink. In the [place] one drink equals one 12 oz bottle of beer (355 mL), one 5 oz glass of wine (148 mL), or one 1 oz glass of hard liquor (44 mL). Lifestyle  Take daily care of your teeth and gums.  Stay active. Exercise for at least 30  minutes on 5 or more days each week.  Do not use any products that contain nicotine or tobacco, such as cigarettes, e-cigarettes, and chewing tobacco. If you need help quitting, ask your health care provider.  If you are sexually active, practice safe sex. Use a condom or other form of birth control (contraception) in order to prevent pregnancy and STIs (sexually transmitted infections).  If told by your health care provider, take low-dose aspirin daily starting at age 2. What's next?  Visit your health care provider once a year for a well check visit.  Ask your health care provider how often you should have your eyes and teeth checked.  Stay up to date on all vaccines. This information is not intended to replace advice given to you by your health care provider. Make sure you discuss any questions you have with your health care provider. Document Released: 02/09/2015 Document Revised: 09/24/2017 Document Reviewed: 09/24/2017 Elsevier Patient Education  2020 [employer].

## 2018-08-25 NOTE — Assessment & Plan Note (Signed)
Noted on fasting labs, glucose of 127, A1C pending.

## 2018-08-25 NOTE — Assessment & Plan Note (Signed)
Stable in the office today, some home readings above goal. Does have some dizziness that began two weeks ago but not convinced that this is BP related.   Will have her continue to monitor.  BMP reviewed. Continue Amlodipine and Benazepril as prescribed.

## 2018-08-25 NOTE — Assessment & Plan Note (Signed)
Tetanus UTD, she will inquire about cost of Shingrix. Pap smear overdue and again declines today despite recommendations.  Colonoscopy overdue and she declines, see notes. She does opt for Cologuard, orders placed. Encouraged a healthy diet with exercise. Exam stable. Labs reviewed.

## 2018-08-25 NOTE — Assessment & Plan Note (Signed)
Chronic, overall stable with some progression per patient. Offered neurology evaluation for maintenance, she agrees. Referral placed.

## 2018-08-25 NOTE — Assessment & Plan Note (Signed)
Intermittent x 2 months, mostly occurring at rest.  BP stable in the office today. Check labs including CBC, A1C, TSH.  Differential diagnoses include inner ear disequilibrium, hyperglycemia, visual cause.   Consider ENT evaluation vs eye evaluation once labs return.

## 2018-08-25 NOTE — Assessment & Plan Note (Signed)
Continues to decline colonoscopy despite family history. I strongly advised she reconsider. She does agree to complete cologuard, orders placed again.

## 2018-08-26 LAB — CBC
HCT: 41.2 % (ref 36.0–46.0)
Hemoglobin: 13.6 g/dL (ref 12.0–15.0)
MCHC: 32.9 g/dL (ref 30.0–36.0)
MCV: 98.3 fl (ref 78.0–100.0)
Platelets: 275 10*3/uL (ref 150.0–400.0)
RBC: 4.19 Mil/uL (ref 3.87–5.11)
RDW: 13 % (ref 11.5–15.5)
WBC: 9.1 10*3/uL (ref 4.0–10.5)

## 2018-08-26 LAB — HEMOGLOBIN A1C: Hgb A1c MFr Bld: 5.7 % (ref 4.6–6.5)

## 2018-08-26 LAB — TSH: TSH: 1.19 u[IU]/mL (ref 0.35–4.50)

## 2018-08-27 MED ORDER — BENAZEPRIL HCL 20 MG PO TABS: 20.0000 mg | ORAL_TABLET | Freq: Every day | ORAL | 3 refills | Status: DC

## 2018-08-27 NOTE — Addendum Note (Signed)
Addended by: Jacqualin Combes on: 08/27/2018 11:44 AM   Modules accepted: Orders

## 2018-08-30 ENCOUNTER — Other Ambulatory Visit: Payer: Self-pay

## 2018-08-30 ENCOUNTER — Emergency Department
Admission: EM | Admit: 2018-08-30 | Discharge: 2018-08-30 | Discharge status: Against Medical Advice | Payer: 59 | Attending: Emergency Medicine | Admitting: Emergency Medicine

## 2018-08-30 ENCOUNTER — Encounter: Payer: Self-pay | Admitting: *Deleted

## 2018-08-30 ENCOUNTER — Emergency Department: Payer: 59

## 2018-08-30 DIAGNOSIS — I499 Cardiac arrhythmia, unspecified: Secondary | ICD-10-CM | POA: Diagnosis not present

## 2018-08-30 DIAGNOSIS — R110 Nausea: Secondary | ICD-10-CM | POA: Insufficient documentation

## 2018-08-30 DIAGNOSIS — Z5321 Procedure and treatment not carried out due to patient leaving prior to being seen by health care provider: Secondary | ICD-10-CM | POA: Diagnosis not present

## 2018-08-30 DIAGNOSIS — R42 Dizziness and giddiness: Secondary | ICD-10-CM | POA: Insufficient documentation

## 2018-08-30 DIAGNOSIS — R002 Palpitations: Secondary | ICD-10-CM | POA: Diagnosis not present

## 2018-08-30 DIAGNOSIS — R000 Tachycardia, unspecified: Secondary | ICD-10-CM | POA: Diagnosis not present

## 2018-08-30 DIAGNOSIS — R0989 Other specified symptoms and signs involving the circulatory and respiratory systems: Secondary | ICD-10-CM | POA: Diagnosis not present

## 2018-08-30 LAB — BASIC METABOLIC PANEL
Anion gap: 10 (ref 5–15)
BUN: 15 mg/dL (ref 8–23)
CO2: 27 mmol/L (ref 22–32)
Calcium: 9.8 mg/dL (ref 8.9–10.3)
Chloride: 105 mmol/L (ref 98–111)
Creatinine, Ser: 0.93 mg/dL (ref 0.44–1.00)
GFR calc Af Amer: >60 mL/min (ref >60)
GFR calc non Af Amer: >60 mL/min (ref >60)
Glucose, Bld: 134 mg/dL — ABNORMAL HIGH (ref 70–99)
Potassium: 3.9 mmol/L (ref 3.5–5.1)
Sodium: 142 mmol/L (ref 135–145)

## 2018-08-30 LAB — TROPONIN I (HIGH SENSITIVITY): Troponin I (High Sensitivity): 3 ng/L (ref <18)

## 2018-08-30 LAB — CBC
HCT: 43.6 % (ref 36.0–46.0)
Hemoglobin: 14.9 g/dL (ref 12.0–15.0)
MCH: 32.9 pg (ref 26.0–34.0)
MCHC: 34.2 g/dL (ref 30.0–36.0)
MCV: 96.2 fL (ref 80.0–100.0)
Platelets: 301 10*3/uL (ref 150–400)
RBC: 4.53 MIL/uL (ref 3.87–5.11)
RDW: 12.9 % (ref 11.5–15.5)
WBC: 10.4 10*3/uL (ref 4.0–10.5)
nRBC: 0 % (ref 0.0–0.2)

## 2018-08-30 NOTE — ED Notes (Signed)
No answer when called several times from lobby 

## 2018-08-30 NOTE — ED Triage Notes (Addendum)
Pt to ED via EMS reporting sudden onset of fluttering in chest with nausea today. PT has been having dizzy spells for three weeks. Symptoms have improved with EMS. EMS reporting pt was having episodes of tachycardia upon their arrival to her house but her HR was varying and pt reported intermittently feeling dizzy when her HR would change.   No cardiac hc other than HTN but has been taking medications as prescribed.    CBG 138

## 2018-08-31 ENCOUNTER — Ambulatory Visit (INDEPENDENT_AMBULATORY_CARE_PROVIDER_SITE_OTHER): Payer: 59 | Admitting: Family Medicine

## 2018-08-31 ENCOUNTER — Telehealth: Payer: Self-pay

## 2018-08-31 ENCOUNTER — Encounter: Payer: Self-pay | Admitting: Family Medicine

## 2018-08-31 ENCOUNTER — Other Ambulatory Visit: Payer: Self-pay

## 2018-08-31 VITALS — BP 130/80 | HR 92 | Temp 98.0°F | Ht 64.0 in | Wt 154.0 lb

## 2018-08-31 DIAGNOSIS — R55 Syncope and collapse: Secondary | ICD-10-CM | POA: Diagnosis not present

## 2018-08-31 DIAGNOSIS — R002 Palpitations: Secondary | ICD-10-CM

## 2018-08-31 DIAGNOSIS — R42 Dizziness and giddiness: Secondary | ICD-10-CM | POA: Diagnosis not present

## 2018-08-31 DIAGNOSIS — R000 Tachycardia, unspecified: Secondary | ICD-10-CM | POA: Diagnosis not present

## 2018-08-31 MED ORDER — METOPROLOL TARTRATE 25 MG PO TABS: 12.5000 mg | ORAL_TABLET | Freq: Every day | ORAL | 0 refills | Status: AC | PRN

## 2018-08-31 NOTE — Telephone Encounter (Signed)
Pt was at ED yesterday due to dizziness (on and off for 3 wks; room does not spin but pt feels lightheaded) and fast heart rate with flutter(new); pt waited for hours and then left ED. Pt had labs, CXR and EKG. On 08/30/18 BP 108/64 P 173  On 08/31/18 now BP 133/99 P 98. Pt taking amlodipine 10 mg one daily and benazapril 20 mg one daily. No CP,SOB, slight h/a on 08/30/18 but none today and slight dizziness today. Pt has muscular dystrophy and arms and legs do not feel more weak than usual. Pt can walk and grasp cup.no vision changes, no N&V. Pt has no covid symptoms, no travel and no known exposure to + covid. Dr Diona Browner reviewed EKG from ED and said OK for pt to come in to office. ED precautions given and pt voiced understanding. Pt scheduled in office appt 08/31/18 at 10:40. FYI to DR [facility].

## 2018-08-31 NOTE — Assessment & Plan Note (Signed)
No clear secondary cause... likely from sinus tachycardia.

## 2018-08-31 NOTE — Assessment & Plan Note (Signed)
No clear trigger. Neg TSH, cbc.  Avoid decongestants and caffeine.  EKG repeat shows no ST changes or Q waves. Given metoprolol 25 mg to use 1/2 tab prn palpitations BID.  refer to cardiology for further recommendations.

## 2018-08-31 NOTE — Patient Instructions (Addendum)
Stop caffeine.  Stop decongestants. We will call with cardiology referral. Can use metoprolol as needed for palpitations. If chest pain, shortness of breath.. go to ER.

## 2018-08-31 NOTE — Progress Notes (Signed)
  Chief Complaint  Patient presents with  . Dizziness    History of Present Illness: HPI   63 year old female with muscular dystrophy, HTN presents with new onset dizziness and palpitations intermittantly in last 2 weeks.   Went to ED yesterday: left prior to being seen given wait.  HAd sudden onset of dizziness/presyncope.. then noted heart fluttering. Lasted few monutes  BP was 106/84 HR 175.  Called EMS. EKG: sinus tach 130s BMET nml, glucose 134 nonfasting. Cbc normal, no anemia, wbc 10.4 Troponin I x 1 neg.  CXR; clear   Was seen by PCP on 08/25/2018 for dizziness. Labs were nml including thyroid.  Reports dizziness: lightheadedness, no verytigo No  exertional chest pain/ pressure, no SOB, no fatigue. Did have mild indigestion yesterday after eating, at rest. No new meds.   She has been taking OTC loratidine.. no sure if there is a decongestant in it.  She drinks coffee 3 cups a week.. not daily. Denies anxiety.    HAs history of similar years ago when was ging to [facility].. wore holter about 9 years ago.  COVID 19 screen No recent travel or known exposure to COVID19 The patient denies respiratory symptoms of COVID 19 at this time.  The importance of social distancing was discussed today.   ROS    Past Medical History:  Diagnosis Date  . Anxiety   . Hyperlipidemia   . Hypertension   . Muscular dystrophy ([place])     reports that she has never smoked. She has never used smokeless tobacco. She reports current alcohol use. She reports that she does not use drugs.   Current Outpatient Medications:  .  amLODipine (NORVASC) 10 MG tablet, Take 1 tablet (10 mg total) by mouth daily. For blood pressure., Disp: 90 tablet, Rfl: 3 .  benazepril (LOTENSIN) 20 MG tablet, Take 1 tablet (20 mg total) by mouth daily. For blood pressure., Disp: 90 tablet, Rfl: 3 .  cyclobenzaprine (FLEXERIL) 10 MG tablet, TAKE 1/2 TO 1 TABLETS (5-10 MG TOTAL) BY MOUTH AT BEDTIME AS NEEDED FOR  MUSCLE SPASMS., Disp: 30 tablet, Rfl: 0 .  etodolac (LODINE) 400 MG tablet, Take 1 tablet (400 mg total) by mouth 2 (two) times daily as needed., Disp: 60 tablet, Rfl: 0   Observations/Objective: Blood pressure 130/80, pulse 92, temperature 98 F (36.7 C), temperature source Temporal, height 5\' 4"  (1.626 m), weight 154 lb (69.9 kg), SpO2 98 %.  Physical Exam Cardiovascular:     Rate and Rhythm: Tachycardia present.     Pulses: Normal pulses.     Heart sounds: No systolic murmur. No diastolic murmur.  Musculoskeletal:     Right lower leg: No edema.     Left lower leg: No edema.      Assessment and Plan   Presyncope and palpitations   EKG: shows sinus tach NO clear MI.. neg troponin I x 1 in ER.   No anemia, no thyroid issue. No new meds.. make sure not taking decongestant in generic allergy med.  Avoid decongestants and  caffeine.  Can use metoprolol prn palpitations.  Refer to cardiology for further eval.   Eliezer Lofts, MD

## 2018-09-22 ENCOUNTER — Other Ambulatory Visit: Payer: Self-pay | Admitting: Family Medicine

## 2018-09-22 NOTE — Telephone Encounter (Signed)
Last prescribed on 08/31/2018 by Dr Diona Browner . Last appointment on 08/31/2018 . No future appointment

## 2018-09-23 NOTE — Telephone Encounter (Signed)
Is she taking this daily? Should be used as needed. Did she go to cardiology?

## 2018-09-24 NOTE — Telephone Encounter (Signed)
Pt had office visit on 08/31/18.

## 2018-09-24 NOTE — Telephone Encounter (Signed)
Spoken to patient and she does not need a refill 

## 2018-10-11 ENCOUNTER — Encounter: Payer: Self-pay | Admitting: Cardiology

## 2018-10-11 ENCOUNTER — Ambulatory Visit (INDEPENDENT_AMBULATORY_CARE_PROVIDER_SITE_OTHER): Payer: 59 | Admitting: Cardiology

## 2018-10-11 ENCOUNTER — Other Ambulatory Visit: Payer: Self-pay

## 2018-10-11 VITALS — BP 150/88 | HR 95 | Ht 64.0 in | Wt 154.0 lb

## 2018-10-11 DIAGNOSIS — I10 Essential (primary) hypertension: Secondary | ICD-10-CM | POA: Diagnosis not present

## 2018-10-11 DIAGNOSIS — H9201 Otalgia, right ear: Secondary | ICD-10-CM | POA: Diagnosis not present

## 2018-10-11 DIAGNOSIS — R002 Palpitations: Secondary | ICD-10-CM | POA: Diagnosis not present

## 2018-10-11 NOTE — Patient Instructions (Addendum)
Medication Instructions:  - Your physician recommends that you continue on your current medications as directed. Please refer to the Current Medication list given to you today.  If you need a refill on your cardiac medications before your next appointment, please call your pharmacy.   Lab work: - none ordered  If you have labs (blood work) drawn today and your tests are completely normal, you will receive your results only by: . MyChart Message (if you have MyChart) OR . A paper copy in the mail If you have any lab test that is abnormal or we need to change your treatment, we will call you to review the results.  Testing/Procedures: - none ordered  Follow-Up: At [facility], you and your health needs are our priority.  As part of our continuing mission to provide you with exceptional heart care, we have created designated Provider Care Teams.  These Care Teams include your primary Cardiologist (physician) and Advanced Practice Providers (APPs -  Physician Assistants and Nurse Practitioners) who all work together to provide you with the care you need, when you need it. . as needed  Any Other Special Instructions Will Be Listed Below (If Applicable). - N/A   

## 2018-10-11 NOTE — Progress Notes (Signed)
Cardiology Office Note:    Date:  10/11/2018   ID:  Jeanne Taylor, DOB 01-02-1956, MRN [id number]  PCP:  Doreene Nest, NP  Cardiologist:  Debbe Odea, MD  Electrophysiologist:  None   Referring MD: Excell Seltzer, MD   Chief Complaint  Patient presents with  . office visit    NEW patient-tachycardia and pre-syncope    History of Present Illness:    Jeanne Taylor is a 63 y.o. female with a hx of hypertension, muscular dystrophy, who presents due to an episode of dizziness and palpitations/fluttering that occurred about a month ago.  She states she was sitting at home, upon waking up to go to the window suddenly felt dizzy and then realized her heart was fluttering.  She checked her heart rate with a home device and noticed her heart rate was about 178.  She then called her daughter, who called EMS.  Upon EMS arrival about 30 minutes later, patient states her symptoms had improved but her heart rate was too fast.  She was taken to the ED where evaluation was planned but her wait time was very long.  She waited in the ED for about 4 hours and was later told it would take another 3 hours before she will be seen which prompted her to leave the ER.  She followed up with her primary care provider who prescribed her a low-dose beta-blocker to take as needed.  She states about a day or 2 after the incident she noted her heart was a little bit fluttery not as bad as prior and took 1 dose of beta-blocker.  She has not had any symptoms since this incident.  She denies any history of cardiac disease.  She denies any history of chest pain shortness of breath, headaches, syncope.  Past Medical History:  Diagnosis Date  . Anxiety   . Hyperlipidemia   . Hypertension   . Muscular dystrophy [facility])     Past Surgical History:  Procedure Laterality Date  . BUNIONECTOMY Right     Current Medications: Current Meds  Medication Sig  . amLODipine (NORVASC) 10 MG tablet Take 1 tablet (10 mg total)  by mouth daily. For blood pressure.  . benazepril (LOTENSIN) 20 MG tablet Take 1 tablet (20 mg total) by mouth daily. For blood pressure.  . metoprolol tartrate (LOPRESSOR) 25 MG tablet Take 0.5 tablets (12.5 mg total) by mouth daily as needed.     Allergies:   Patient has no known allergies.   Social History   Socioeconomic History  . Marital status: Divorced    Spouse name: Not on file  . Number of children: Not on file  . Years of education: Not on file  . Highest education level: Not on file  Occupational History  . Not on file  Social Needs  . Financial resource strain: Not on file  . Food insecurity    Worry: Not on file    Inability: Not on file  . Transportation needs    Medical: Not on file    Non-medical: Not on file  Tobacco Use  . Smoking status: Never Smoker  . Smokeless tobacco: Never Used  Substance and Sexual Activity  . Alcohol use: Yes    Alcohol/week: 0.0 standard drinks    Comment: social  . Drug use: No  . Sexual activity: Not on file  Lifestyle  . Physical activity    Days per week: Not on file    Minutes per session: Not on  file  . Stress: Not on file  Relationships  . Social Musicianconnections    Talks on phone: Not on file    Gets together: Not on file    Attends religious service: Not on file    Active member of club or organization: Not on file    Attends meetings of clubs or organizations: Not on file    Relationship status: Not on file  Other Topics Concern  . Not on file  Social History Narrative   Single.   1 daughter, adopted.   No grandchildren.   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   Lives at home with her Harlow MaresGodson.    Enjoys cooking, cleaning, decorating.      Family History: The patient's family history includes Colon cancer in her mother; Hypertension in her father; Muscular dystrophy in her brother.  ROS:   Please see the history of present illness.     All other systems reviewed and are negative.  EKGs/Labs/Other Studies  Reviewed:    The following studies were reviewed today:   EKG:  EKG is  ordered today.  The ekg ordered today demonstrates normal sinus rhythm, incomplete right bundle branch block.  Recent Labs: 08/23/2018: ALT 30 08/25/2018: TSH 1.19 08/30/2018: BUN 15; Creatinine, Ser 0.93; Hemoglobin 14.9; Platelets 301; Potassium 3.9; Sodium 142  Recent Lipid Panel    Component Value Date/Time   CHOL 197 08/23/2018 0952   CHOL 151 02/01/2012 0540   TRIG 94.0 08/23/2018 0952   TRIG 58 02/01/2012 0540   HDL 76.20 08/23/2018 0952   HDL 94 (H) 02/01/2012 0540   CHOLHDL 3 08/23/2018 0952   VLDL 18.8 08/23/2018 0952   VLDL 12 02/01/2012 0540   LDLCALC 102 (H) 08/23/2018 0952   LDLCALC 77 08/07/2017 1544   LDLCALC 45 02/01/2012 0540    Physical Exam:    VS:  BP (!) 150/88 (BP Location: Right Arm, Patient Position: Sitting, Cuff Size: Normal)   Pulse 95   Ht 5\' 4"  (1.626 m)   Wt 154 lb (69.9 kg)   BMI 26.43 kg/m     Orthostatic bp measurements reveal  HR BP SYMPTOMS  Lying 94 127/86 none  Sitting 94 130/85 none  Standing (0min) 101 130/85 none  Standing (3min) 93 141/88 none    Wt Readings from Last 3 Encounters:  10/11/18 154 lb (69.9 kg)  08/31/18 154 lb (69.9 kg)  08/30/18 156 lb (70.8 kg)     GEN:  Well nourished, well developed in no acute distress HEENT: Normal NECK: No JVD; No carotid bruits LYMPHATICS: No lymphadenopathy CARDIAC: RRR, no murmurs, rubs, gallops RESPIRATORY:  Clear to auscultation without rales, wheezing or rhonchi. Decreased bs on inspiration on right side.  ABDOMEN: Soft, non-tender, non-distended MUSCULOSKELETAL:  No edema; No deformity  SKIN: Warm and dry NEUROLOGIC:  Alert and oriented x 3 PSYCHIATRIC:  Normal affect   ASSESSMENT:   Patient's orthostatic blood pressure measured in the office did not reveal any evidence of orthostasis.  This was a one-time event and since she does not have any cardiac history, with no symptoms occurring prior to this  incident, and no symptoms occurring after the incident, I do not think further cardiac testing is indicated at this point. 1. Palpitations   2. Essential hypertension    PLAN:    In order of problems listed above:  1. Reassured patient that this is a one-time event.  At this point we will continue to monitor patient closely.  If symptoms recur  or occur more frequently we will consider a cardiac monitor and consider further testing.  Advised patient to call the office if further symptoms occur or if during off office hours can go to the ED.  2. Continue taking blood pressure meds as prescribed.   Medication Adjustments/Labs and Tests Ordered: Current medicines are reviewed at length with the patient today.  Concerns regarding medicines are outlined above.  Orders Placed This Encounter  Procedures  . EKG 12-Lead   No orders of the defined types were placed in this encounter.   Patient Instructions  Medication Instructions:  - Your physician recommends that you continue on your current medications as directed. Please refer to the Current Medication list given to you today.  If you need a refill on your cardiac medications before your next appointment, please call your pharmacy.   Lab work: - none ordered  If you have labs (blood work) drawn today and your tests are completely normal, you will receive your results only by: Marland Kitchen MyChart Message (if you have MyChart) OR . A paper copy in the mail If you have any lab test that is abnormal or we need to change your treatment, we will call you to review the results.  Testing/Procedures: - none ordered  Follow-Up: At [facility], you and your health needs are our priority.  As part of our continuing mission to provide you with exceptional heart care, we have created designated Provider Care Teams.  These Care Teams include your primary Cardiologist (physician) and Advanced Practice Providers (APPs -  Physician Assistants and Nurse  Practitioners) who all work together to provide you with the care you need, when you need it. Marland Kitchen as needed  Any Other Special Instructions Will Be Listed Below (If Applicable). - N/A      Signed, Kate Sable, MD  10/11/2018 2:52 PM    [facility]

## 2018-11-05 DIAGNOSIS — Z23 Encounter for immunization: Secondary | ICD-10-CM | POA: Diagnosis not present

## 2019-04-07 ENCOUNTER — Ambulatory Visit: Payer: 59 | Attending: Internal Medicine

## 2019-04-07 ENCOUNTER — Ambulatory Visit: Payer: 59

## 2019-04-07 DIAGNOSIS — Z23 Encounter for immunization: Secondary | ICD-10-CM

## 2019-04-07 NOTE — Progress Notes (Signed)
   Covid-19 Vaccination Clinic  Name:  Jeanne Taylor    MRN: [id number] DOB: April 14, 1955  04/07/2019  [name] was observed post Covid-19 immunization for 15 minutes without incident. She was provided with Vaccine Information Sheet and instruction to access the V-Safe system.   [name] was instructed to call 911 with any severe reactions post vaccine: Marland Kitchen Difficulty breathing  . Swelling of face and throat  . A fast heartbeat  . A bad rash all over body  . Dizziness and weakness   Immunizations Administered    Name Date Dose VIS Date Route   Pfizer COVID-19 Vaccine 04/07/2019  9:41 AM 0.3 mL 01/07/2019 Intramuscular   Manufacturer: [employer], [employer]   Lot: [id number]   NDC: [address]-1

## 2019-04-26 ENCOUNTER — Ambulatory Visit: Payer: 59 | Attending: Internal Medicine

## 2019-04-26 DIAGNOSIS — Z23 Encounter for immunization: Secondary | ICD-10-CM

## 2019-04-26 NOTE — Progress Notes (Signed)
   Covid-19 Vaccination Clinic  Name:  Jeanne Taylor    MRN: [id number] DOB: Dec 13, 1955  04/26/2019  [name] was observed post Covid-19 immunization for 15 minutes without incident. She was provided with Vaccine Information Sheet and instruction to access the V-Safe system.   [name] was instructed to call 911 with any severe reactions post vaccine: Marland Kitchen Difficulty breathing  . Swelling of face and throat  . A fast heartbeat  . A bad rash all over body  . Dizziness and weakness   Immunizations Administered    Name Date Dose VIS Date Route   Pfizer COVID-19 Vaccine 04/26/2019  1:22 PM 0.3 mL 01/07/2019 Intramuscular   Manufacturer: [employer], [employer]   Lot: [id number]   NDC: [address]-2

## 2019-09-08 ENCOUNTER — Other Ambulatory Visit: Payer: Self-pay | Admitting: Primary Care

## 2019-09-08 DIAGNOSIS — I10 Essential (primary) hypertension: Secondary | ICD-10-CM

## 2019-09-27 ENCOUNTER — Other Ambulatory Visit: Payer: Self-pay | Admitting: Primary Care

## 2019-09-27 DIAGNOSIS — E559 Vitamin D deficiency, unspecified: Secondary | ICD-10-CM

## 2019-09-27 DIAGNOSIS — R7303 Prediabetes: Secondary | ICD-10-CM

## 2019-09-27 DIAGNOSIS — I10 Essential (primary) hypertension: Secondary | ICD-10-CM

## 2019-09-27 DIAGNOSIS — Z114 Encounter for screening for human immunodeficiency virus [HIV]: Secondary | ICD-10-CM

## 2019-10-02 ENCOUNTER — Other Ambulatory Visit: Payer: Self-pay | Admitting: Primary Care

## 2019-10-02 DIAGNOSIS — I10 Essential (primary) hypertension: Secondary | ICD-10-CM

## 2019-10-05 ENCOUNTER — Other Ambulatory Visit (INDEPENDENT_AMBULATORY_CARE_PROVIDER_SITE_OTHER): Payer: 59

## 2019-10-05 ENCOUNTER — Other Ambulatory Visit: Payer: Self-pay

## 2019-10-05 DIAGNOSIS — E559 Vitamin D deficiency, unspecified: Secondary | ICD-10-CM | POA: Diagnosis not present

## 2019-10-05 DIAGNOSIS — I10 Essential (primary) hypertension: Secondary | ICD-10-CM

## 2019-10-05 DIAGNOSIS — Z114 Encounter for screening for human immunodeficiency virus [HIV]: Secondary | ICD-10-CM

## 2019-10-05 DIAGNOSIS — R7303 Prediabetes: Secondary | ICD-10-CM

## 2019-10-05 DIAGNOSIS — R69 Illness, unspecified: Secondary | ICD-10-CM | POA: Diagnosis not present

## 2019-10-05 LAB — LIPID PANEL
Cholesterol: 194 mg/dL (ref 0–200)
HDL: 77.6 mg/dL (ref >39.00)
LDL Cholesterol: 101 mg/dL — ABNORMAL HIGH (ref 0–99)
NonHDL: 116.59
Total CHOL/HDL Ratio: 3
Triglycerides: 77 mg/dL (ref 0.0–149.0)
VLDL: 15.4 mg/dL (ref 0.0–40.0)

## 2019-10-05 LAB — COMPREHENSIVE METABOLIC PANEL
ALT: 40 U/L — ABNORMAL HIGH (ref 0–35)
AST: 29 U/L (ref 0–37)
Albumin: 4.5 g/dL (ref 3.5–5.2)
Alkaline Phosphatase: 56 U/L (ref 39–117)
BUN: 15 mg/dL (ref 6–23)
CO2: 29 mEq/L (ref 19–32)
Calcium: 9.9 mg/dL (ref 8.4–10.5)
Chloride: 103 mEq/L (ref 96–112)
Creatinine, Ser: 0.45 mg/dL (ref 0.40–1.20)
GFR: 169.41 mL/min (ref >60.00)
Glucose, Bld: 100 mg/dL — ABNORMAL HIGH (ref 70–99)
Potassium: 4.4 mEq/L (ref 3.5–5.1)
Sodium: 140 mEq/L (ref 135–145)
Total Bilirubin: 0.8 mg/dL (ref 0.2–1.2)
Total Protein: 7.2 g/dL (ref 6.0–8.3)

## 2019-10-05 LAB — VITAMIN D 25 HYDROXY (VIT D DEFICIENCY, FRACTURES): VITD: 81.45 ng/mL (ref 30.00–100.00)

## 2019-10-05 LAB — HEMOGLOBIN A1C: Hgb A1c MFr Bld: 5.8 % (ref 4.6–6.5)

## 2019-10-06 ENCOUNTER — Other Ambulatory Visit: Payer: 59

## 2019-10-06 LAB — HIV ANTIBODY (ROUTINE TESTING W REFLEX): HIV 1&2 Ab, 4th Generation: NONREACTIVE

## 2019-10-11 ENCOUNTER — Encounter: Payer: Self-pay | Admitting: Primary Care

## 2019-10-11 ENCOUNTER — Other Ambulatory Visit: Payer: Self-pay

## 2019-10-11 ENCOUNTER — Ambulatory Visit (INDEPENDENT_AMBULATORY_CARE_PROVIDER_SITE_OTHER): Payer: 59 | Admitting: Primary Care

## 2019-10-11 VITALS — BP 130/74 | HR 104 | Temp 98.5°F | Ht 64.0 in | Wt 159.0 lb

## 2019-10-11 DIAGNOSIS — Z800 Family history of malignant neoplasm of digestive organs: Secondary | ICD-10-CM | POA: Diagnosis not present

## 2019-10-11 DIAGNOSIS — Z1231 Encounter for screening mammogram for malignant neoplasm of breast: Secondary | ICD-10-CM | POA: Diagnosis not present

## 2019-10-11 DIAGNOSIS — I10 Essential (primary) hypertension: Secondary | ICD-10-CM | POA: Diagnosis not present

## 2019-10-11 DIAGNOSIS — G7100 Muscular dystrophy, unspecified: Secondary | ICD-10-CM | POA: Diagnosis not present

## 2019-10-11 DIAGNOSIS — E559 Vitamin D deficiency, unspecified: Secondary | ICD-10-CM

## 2019-10-11 DIAGNOSIS — G8929 Other chronic pain: Secondary | ICD-10-CM | POA: Diagnosis not present

## 2019-10-11 DIAGNOSIS — R7303 Prediabetes: Secondary | ICD-10-CM | POA: Diagnosis not present

## 2019-10-11 DIAGNOSIS — Z23 Encounter for immunization: Secondary | ICD-10-CM | POA: Diagnosis not present

## 2019-10-11 DIAGNOSIS — M545 Low back pain, unspecified: Secondary | ICD-10-CM

## 2019-10-11 DIAGNOSIS — Z0000 Encounter for general adult medical examination without abnormal findings: Secondary | ICD-10-CM

## 2019-10-11 MED ORDER — AMLODIPINE BESYLATE 10 MG PO TABS: ORAL_TABLET | ORAL | 3 refills | Status: DC

## 2019-10-11 MED ORDER — BENAZEPRIL HCL 20 MG PO TABS: ORAL_TABLET | ORAL | 3 refills | Status: DC

## 2019-10-11 NOTE — Assessment & Plan Note (Addendum)
Well controlled in the office today, continue benazepril and amlodipine. CMP reviewed.   ASCVD risk score of 7.9%, discussed this with patient. She kindly declines statin therapy, would like to work on lifestyle changes. Continue to monitor.

## 2019-10-11 NOTE — Assessment & Plan Note (Signed)
Stable compared to last year. Continue to monitor.

## 2019-10-11 NOTE — Assessment & Plan Note (Signed)
Well controlled on vitamin D supplementation.

## 2019-10-11 NOTE — Progress Notes (Signed)
Subjective:    Patient ID: Jeanne Taylor, female    DOB: [birth year], 64 y.o.   MRN: [id number]  HPI  This visit occurred during the SARS-CoV-2 public health emergency.  Safety protocols were in place, including screening questions prior to the visit, additional usage of staff PPE, and extensive cleaning of exam room while observing appropriate contact time as indicated for disinfecting solutions.   Ms. Jeanne Taylor is a 64 year old female who presents today for complete physical.  The 10-year ASCVD risk score Denman George DC Montez Hageman., et al., 2013) is: 7.9%   Values used to calculate the score:     Age: 58 years     Sex: Female     Is Non-Hispanic African American: Yes     Diabetic: No     Tobacco smoker: No     Systolic Blood Pressure: 130 mmHg     Is BP treated: Yes     HDL Cholesterol: 77.6 mg/dL     Total Cholesterol: 194 mg/dL   Immunizations: -Tetanus: Completed in 2019 -Influenza: Due this season, will get at work -Shingles: Never completed  -Covid-19: Completed series   Diet: She endorses a fair diet.  Exercise: She is not exercising much  Eye exam: Completes annually  Dental exam: Completes semi-annually   Pap Smear: Declines  Mammogram: No recent mammogram on file.  Colonoscopy: Never completed. Also has not completed Cologuard on numerous occasions.  Hep C Screen: Negative  BP Readings from Last 3 Encounters:  10/11/19 130/74  10/11/18 (!) 150/88  08/31/18 130/80   Wt Readings from Last 3 Encounters:  10/11/19 159 lb (72.1 kg)  10/11/18 154 lb (69.9 kg)  08/31/18 154 lb (69.9 kg)     Review of Systems  Constitutional: Negative for unexpected weight change.  HENT: Negative for rhinorrhea.   Respiratory: Negative for cough and shortness of breath.   Cardiovascular: Negative for chest pain.  Gastrointestinal: Negative for constipation and diarrhea.  Genitourinary: Negative for difficulty urinating and menstrual problem.  Musculoskeletal: Negative for arthralgias and  myalgias.  Skin: Negative for rash.  Allergic/Immunologic: Negative for environmental allergies.  Neurological: Negative for dizziness, numbness and headaches.       Past Medical History:  Diagnosis Date  . Anxiety   . Hyperlipidemia   . Hypertension   . Muscular dystrophy ([facility])      Social History   Socioeconomic History  . Marital status: Divorced    Spouse name: Not on file  . Number of children: Not on file  . Years of education: Not on file  . Highest education level: Not on file  Occupational History  . Not on file  Tobacco Use  . Smoking status: Never Smoker  . Smokeless tobacco: Never Used  Vaping Use  . Vaping Use: Never used  Substance and Sexual Activity  . Alcohol use: Yes    Alcohol/week: 0.0 standard drinks    Comment: social  . Drug use: No  . Sexual activity: Not on file  Other Topics Concern  . Not on file  Social History Narrative   Single.   1 daughter, adopted.   No grandchildren.   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   Lives at home with her Harlow Mares.    Enjoys cooking, cleaning, decorating.    Social Determinants of Health   Financial Resource Strain:   . Difficulty of Paying Living Expenses: Not on file  Food Insecurity:   . Worried About Programme researcher, broadcasting/film/video in the Last Year:  Not on file  . Ran Out of Food in the Last Year: Not on file  Transportation Needs:   . Lack of Transportation (Medical): Not on file  . Lack of Transportation (Non-Medical): Not on file  Physical Activity:   . Days of Exercise per Week: Not on file  . Minutes of Exercise per Session: Not on file  Stress:   . Feeling of Stress : Not on file  Social Connections:   . Frequency of Communication with Friends and Family: Not on file  . Frequency of Social Gatherings with Friends and Family: Not on file  . Attends Religious Services: Not on file  . Active Member of Clubs or Organizations: Not on file  . Attends Banker Meetings: Not on file  . Marital  Status: Not on file  Intimate Partner Violence:   . Fear of Current or Ex-Partner: Not on file  . Emotionally Abused: Not on file  . Physically Abused: Not on file  . Sexually Abused: Not on file    Past Surgical History:  Procedure Laterality Date  . BUNIONECTOMY Right     Family History  Problem Relation Age of Onset  . Colon cancer Mother   . Hypertension Father   . Muscular dystrophy Brother     No Known Allergies  Current Outpatient Medications on File Prior to Visit  Medication Sig Dispense Refill  . amLODipine (NORVASC) 10 MG tablet TAKE 1 TABLET BY MOUTH EVERY DAY FOR BLOOD PRESSURE 30 tablet 0  . benazepril (LOTENSIN) 20 MG tablet TAKE 1 TABLET (20 MG TOTAL) BY MOUTH DAILY. FOR BLOOD PRESSURE. NEED APPT FOR MORE REFILLS 30 tablet 0  . metoprolol tartrate (LOPRESSOR) 25 MG tablet Take 0.5 tablets (12.5 mg total) by mouth daily as needed. 30 tablet 0   No current facility-administered medications on file prior to visit.    BP 130/74   Pulse (!) 104   Temp 98.5 F (36.9 C) (Temporal)   Ht 5\' 4"  (1.626 m)   Wt 159 lb (72.1 kg)   SpO2 97%   BMI 27.29 kg/m    Objective:   Physical Exam HENT:     Right Ear: Tympanic membrane and ear canal normal.     Left Ear: Tympanic membrane and ear canal normal.  Eyes:     Pupils: Pupils are equal, round, and reactive to light.  Cardiovascular:     Rate and Rhythm: Normal rate and regular rhythm.  Pulmonary:     Effort: Pulmonary effort is normal.     Breath sounds: Normal breath sounds.  Abdominal:     General: Bowel sounds are normal.     Palpations: Abdomen is soft.     Tenderness: There is no abdominal tenderness.  Musculoskeletal:     Cervical back: Neck supple.     Comments: Stable ambulation with cane in clinic.   Skin:    General: Skin is warm and dry.  Neurological:     Mental Status: She is alert and oriented to person, place, and time.     Cranial Nerves: No cranial nerve deficit.  Psychiatric:         Mood and Affect: Mood normal.            Assessment & Plan:

## 2019-10-11 NOTE — Patient Instructions (Addendum)
Be sure to work on a healthy diet. Ensure you are consuming 64 ounces of water daily.  Start exercising. You should be getting 150 minutes of exercise weekly.  Notify me if you change your mind regarding physical therapy.  [facility] your mamm[facility].   It was a pleasure to see you today!     You have an appointment scheduled for: []   2D Mammogram  [x]   3D Mammogram  []   Bone Density   Call number for appointment    Your appointment will at the following location  [x]  [facility]  Phone number: [phone]  Make sure to wear two peace clothing  No lotions powders or deodorants the day of the appointment Make sure to bring picture ID and insurance card.  Bring list of medications you are currently taking including any supplements.

## 2019-10-11 NOTE — Assessment & Plan Note (Signed)
Denies concerns for back pain.

## 2019-10-11 NOTE — Assessment & Plan Note (Signed)
Appears stable. Offered neurology referral or physical therapy for which she kindly declines. She will update if she changes her mind.

## 2019-10-11 NOTE — Assessment & Plan Note (Addendum)
First Shingrix dose provided today. Will get flu shot at work. Cologuard completed last year per patient, will call regarding those results. Declines pap smear despite recommendations. Mammogram due, ordered today.  Discussed the importance of a healthy diet and regular exercise in order for weight loss, and to reduce the risk of any potential medical problems.  Exam today stable. Labs reviewed.

## 2019-10-11 NOTE — Assessment & Plan Note (Signed)
Continues to decline colonoscopy despite recommendations and family history. She endorses that she completed Cologuard last year but we do not have those results.  Will call for results.

## 2019-10-11 NOTE — Addendum Note (Signed)
Addended by: Donnamarie Poag on: 10/11/2019 03:50 PM   Modules accepted: Orders

## 2020-08-03 ENCOUNTER — Other Ambulatory Visit: Payer: Self-pay | Admitting: Primary Care

## 2020-08-03 DIAGNOSIS — I10 Essential (primary) hypertension: Secondary | ICD-10-CM

## 2020-08-03 NOTE — Telephone Encounter (Signed)
Refills sent to pharmacy. 

## 2020-08-03 NOTE — Telephone Encounter (Signed)
The patient should have refills on these medications through September 2022. What's going on?  Patient will need to be seen in September for CPE for further refills.

## 2020-08-03 NOTE — Telephone Encounter (Signed)
Called patient she will call pharmacy about refill. I have made CPE for September. If any issues she will give our office a call

## 2020-10-04 ENCOUNTER — Other Ambulatory Visit: Payer: Self-pay

## 2020-10-04 ENCOUNTER — Encounter: Payer: Self-pay | Admitting: Primary Care

## 2020-10-04 ENCOUNTER — Ambulatory Visit (INDEPENDENT_AMBULATORY_CARE_PROVIDER_SITE_OTHER): Payer: Managed Care, Other (non HMO) | Admitting: Primary Care

## 2020-10-04 VITALS — BP 126/82 | HR 99 | Temp 97.2°F | Ht 64.0 in | Wt 156.0 lb

## 2020-10-04 DIAGNOSIS — G7100 Muscular dystrophy, unspecified: Secondary | ICD-10-CM | POA: Diagnosis not present

## 2020-10-04 DIAGNOSIS — Z1211 Encounter for screening for malignant neoplasm of colon: Secondary | ICD-10-CM

## 2020-10-04 DIAGNOSIS — E2839 Other primary ovarian failure: Secondary | ICD-10-CM

## 2020-10-04 DIAGNOSIS — Z800 Family history of malignant neoplasm of digestive organs: Secondary | ICD-10-CM

## 2020-10-04 DIAGNOSIS — E559 Vitamin D deficiency, unspecified: Secondary | ICD-10-CM | POA: Diagnosis not present

## 2020-10-04 DIAGNOSIS — Z23 Encounter for immunization: Secondary | ICD-10-CM | POA: Diagnosis not present

## 2020-10-04 DIAGNOSIS — I10 Essential (primary) hypertension: Secondary | ICD-10-CM

## 2020-10-04 DIAGNOSIS — R7303 Prediabetes: Secondary | ICD-10-CM

## 2020-10-04 DIAGNOSIS — Z0000 Encounter for general adult medical examination without abnormal findings: Secondary | ICD-10-CM | POA: Diagnosis not present

## 2020-10-04 DIAGNOSIS — Z1231 Encounter for screening mammogram for malignant neoplasm of breast: Secondary | ICD-10-CM

## 2020-10-04 LAB — CBC
HCT: 43 % (ref 36.0–46.0)
Hemoglobin: 14.4 g/dL (ref 12.0–15.0)
MCHC: 33.5 g/dL (ref 30.0–36.0)
MCV: 99.9 fl (ref 78.0–100.0)
Platelets: 226 10*3/uL (ref 150.0–400.0)
RBC: 4.31 Mil/uL (ref 3.87–5.11)
RDW: 13.5 % (ref 11.5–15.5)
WBC: 6.9 10*3/uL (ref 4.0–10.5)

## 2020-10-04 LAB — COMPREHENSIVE METABOLIC PANEL
ALT: 33 U/L (ref 0–35)
AST: 30 U/L (ref 0–37)
Albumin: 4.3 g/dL (ref 3.5–5.2)
Alkaline Phosphatase: 54 U/L (ref 39–117)
BUN: 13 mg/dL (ref 6–23)
CO2: 28 mEq/L (ref 19–32)
Calcium: 9.8 mg/dL (ref 8.4–10.5)
Chloride: 101 mEq/L (ref 96–112)
Creatinine, Ser: 0.41 mg/dL (ref 0.40–1.20)
GFR: 103.12 mL/min (ref >60.00)
Glucose, Bld: 114 mg/dL — ABNORMAL HIGH (ref 70–99)
Potassium: 3.9 mEq/L (ref 3.5–5.1)
Sodium: 139 mEq/L (ref 135–145)
Total Bilirubin: 0.8 mg/dL (ref 0.2–1.2)
Total Protein: 7.3 g/dL (ref 6.0–8.3)

## 2020-10-04 LAB — VITAMIN D 25 HYDROXY (VIT D DEFICIENCY, FRACTURES): VITD: 85.81 ng/mL (ref 30.00–100.00)

## 2020-10-04 LAB — LIPID PANEL
Cholesterol: 194 mg/dL (ref 0–200)
HDL: 81 mg/dL (ref >39.00)
LDL Cholesterol: 100 mg/dL — ABNORMAL HIGH (ref 0–99)
NonHDL: 112.83
Total CHOL/HDL Ratio: 2
Triglycerides: 64 mg/dL (ref 0.0–149.0)
VLDL: 12.8 mg/dL (ref 0.0–40.0)

## 2020-10-04 LAB — HEMOGLOBIN A1C: Hgb A1c MFr Bld: 5.7 % (ref 4.6–6.5)

## 2020-10-04 NOTE — Assessment & Plan Note (Signed)
Well controlled in the office today, continue amlodipine 10 mg and benazepril 20 mg daily, metoprolol tartrate 12.5 mg BID.   CMP pending.

## 2020-10-04 NOTE — Assessment & Plan Note (Addendum)
Chronic, stable per patient. She has declined neurology and PT referrals previously.  Offered PT and neurology referrals today, she declines and will update if she changes her mind.

## 2020-10-04 NOTE — Assessment & Plan Note (Signed)
Second Shingrix overdue, provided today. She will get flu shot at work. Also due for Prevnar 20, she will set up a nurse visit.   Mammogram overdue, she agrees today, orders placed. Bone density scan due, orders placed.  Colonoscopy overdue, she continues to decline despite family history and recommendations. She will retry Cologuard, will send orders.   Encouraged a healthy diet, regular exercise. Exam stable. Labs pending.

## 2020-10-04 NOTE — Patient Instructions (Signed)
Stop by the lab prior to leaving today. I will notify you of your results once received.   Complete the Cologuard test.  [facility] to schedule your mammogram and bone density scan.  Schedule a nurse visit to complete your first pneumonia vaccine.  It was a pleasure to see you today!  Preventive Care 39 Years and Older, Female Preventive care refers to lifestyle choices and visits with your health care provider that can promote health and wellness. This includes: A yearly physical exam. This is also called an annual wellness visit. Regular dental and eye exams. Immunizations. Screening for certain conditions. Healthy lifestyle choices, such as: Eating a healthy diet. Getting regular exercise. Not using drugs or products that contain nicotine and tobacco. Limiting alcohol use. What can I expect for my preventive care visit? Physical exam Your health care provider will check your: Height and weight. These may be used to calculate your BMI (body mass index). BMI is a measurement that tells if you are at a healthy weight. Heart rate and blood pressure. Body temperature. Skin for abnormal spots. Counseling Your health care provider may ask you questions about your: Past medical problems. Family's medical history. Alcohol, tobacco, and drug use. Emotional well-being. Home life and relationship well-being. Sexual activity. Diet, exercise, and sleep habits. History of falls. Memory and ability to understand (cognition). Work and work Statistician. Pregnancy and menstrual history. Access to firearms. What immunizations do I need? Vaccines are usually given at various ages, according to a schedule. Your health care provider will recommend vaccines for you based on your age, medical history, and lifestyle or other factors, such as travel or where you work. What tests do I need? Blood tests Lipid and cholesterol levels. These may be checked every 5 years, or more often  depending on your overall health. Hepatitis C test. Hepatitis B test. Screening Lung cancer screening. You may have this screening every year starting at age 6 if you have a 30-pack-year history of smoking and currently smoke or have quit within the past 15 years. Colorectal cancer screening. All adults should have this screening starting at age 50 and continuing until age 32. Your health care provider may recommend screening at age 57 if you are at increased risk. You will have tests every 1-10 years, depending on your results and the type of screening test. Diabetes screening. This is done by checking your blood sugar (glucose) after you have not eaten for a while (fasting). You may have this done every 1-3 years. Mammogram. This may be done every 1-2 years. Talk with your health care provider about how often you should have regular mammograms. Abdominal aortic aneurysm (AAA) screening. You may need this if you are a current or former smoker. BRCA-related cancer screening. This may be done if you have a family history of breast, ovarian, tubal, or peritoneal cancers. Other tests STD (sexually transmitted disease) testing, if you are at risk. Bone density scan. This is done to screen for osteoporosis. You may have this done starting at age 79. Talk with your health care provider about your test results, treatment options, and if necessary, the need for more tests. Follow these instructions at home: Eating and drinking  Eat a diet that includes fresh fruits and vegetables, whole grains, lean protein, and low-fat dairy products. Limit your intake of foods with high amounts of sugar, saturated fats, and salt. Take vitamin and mineral supplements as recommended by your health care provider. Do not drink alcohol if  your health care provider tells you not to drink. If you drink alcohol: Limit how much you have to 0-1 drink a day. Be aware of how much alcohol is in your drink. In the [place]  one drink equals one 12 oz bottle of beer (355 mL), one 5 oz glass of wine (148 mL), or one 1 oz glass of hard liquor (44 mL). Lifestyle Take daily care of your teeth and gums. Brush your teeth every morning and night with fluoride toothpaste. Floss one time each day. Stay active. Exercise for at least 30 minutes 5 or more days each week. Do not use any products that contain nicotine or tobacco, such as cigarettes, e-cigarettes, and chewing tobacco. If you need help quitting, ask your health care provider. Do not use drugs. If you are sexually active, practice safe sex. Use a condom or other form of protection in order to prevent STIs (sexually transmitted infections). Talk with your health care provider about taking a low-dose aspirin or statin. Find healthy ways to cope with stress, such as: Meditation, yoga, or listening to music. Journaling. Talking to a trusted person. Spending time with friends and family. Safety Always wear your seat belt while driving or riding in a vehicle. Do not drive: If you have been drinking alcohol. Do not ride with someone who has been drinking. When you are tired or distracted. While texting. Wear a helmet and other protective equipment during sports activities. If you have firearms in your house, make sure you follow all gun safety procedures. What's next? Visit your health care provider once a year for an annual wellness visit. Ask your health care provider how often you should have your eyes and teeth checked. Stay up to date on all vaccines. This information is not intended to replace advice given to you by your health care provider. Make sure you discuss any questions you have with your health care provider. Document Revised: 03/23/2020 Document Reviewed: 01/07/2018 Elsevier Patient Education  2022 [employer].

## 2020-10-04 NOTE — Assessment & Plan Note (Signed)
Overdue for colonoscopy, continues to decline today. Discussed need for colonoscopy given family history and her lack of prior screening.  She agrees to [facility], orders sent.

## 2020-10-04 NOTE — Progress Notes (Signed)
Subjective:    Patient ID: Jeanne Taylor, female    DOB: 1955-04-18, 65 y.o.   MRN: [id number]  HPI  Jeanne Taylor is a very pleasant 65 y.o. female who presents today for complete physical and follow up of chronic conditions.  Immunizations: -Tetanus: 2019 -Influenza: Will get at work -Covid-19: 3 vaccines -Shingles: One Shingrix dose -Pneumonia: Never completed   Diet: Fair diet.  Exercise: No regular exercise.  Eye exam: Completes annually  Dental exam: Completes semi-annually   Mammogram: No recent mammogram on file, has declined previously, agrees today.  Dexa: Never completed, agrees today Colonoscopy: Never completed, declined in prior years. Endorses completing Cologuard in 2020, company lost sample. She agrees to repeat.    BP Readings from Last 3 Encounters:  10/04/20 126/82  10/11/19 130/74  10/11/18 (!) 150/88      Review of Systems  Constitutional:  Negative for unexpected weight change.  HENT:  Negative for rhinorrhea.   Eyes:  Negative for visual disturbance.  Respiratory:  Negative for shortness of breath.   Cardiovascular:  Negative for chest pain.  Gastrointestinal:  Negative for constipation and diarrhea.  Genitourinary:  Negative for difficulty urinating.  Musculoskeletal:  Negative for arthralgias and myalgias.  Skin:  Negative for rash.  Allergic/Immunologic: Positive for environmental allergies.  Neurological:  Negative for dizziness and headaches.  Psychiatric/Behavioral:  The patient is not nervous/anxious.         Past Medical History:  Diagnosis Date   Anxiety    Hyperlipidemia    Hypertension    Muscular dystrophy (HCC)     Social History   Socioeconomic History   Marital status: Divorced    Spouse name: Not on file   Number of children: Not on file   Years of education: Not on file   Highest education level: Not on file  Occupational History   Not on file  Tobacco Use   Smoking status: Never   Smokeless tobacco: Never   Vaping Use   Vaping Use: Never used  Substance and Sexual Activity   Alcohol use: Yes    Alcohol/week: 0.0 standard drinks    Comment: social   Drug use: No   Sexual activity: Not on file  Other Topics Concern   Not on file  Social History Narrative   Single.   1 daughter, adopted.   No grandchildren.   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   Lives at home with her Harlow Mares.    Enjoys cooking, cleaning, decorating.    Social Determinants of Health   Financial Resource Strain: Not on file  Food Insecurity: Not on file  Transportation Needs: Not on file  Physical Activity: Not on file  Stress: Not on file  Social Connections: Not on file  Intimate Partner Violence: Not on file    Past Surgical History:  Procedure Laterality Date   BUNIONECTOMY Right     Family History  Problem Relation Age of Onset   Colon cancer Mother    Hypertension Father    Muscular dystrophy Brother     No Known Allergies  Current Outpatient Medications on File Prior to Visit  Medication Sig Dispense Refill   amLODipine (NORVASC) 10 MG tablet TAKE 1 TABLET BY MOUTH EVERY DAY FOR BLOOD PRESSURE 90 tablet 0   benazepril (LOTENSIN) 20 MG tablet TAKE 1 TABLET (20 MG TOTAL) BY MOUTH DAILY. FOR BLOOD PRESSURE. 90 tablet 0   metoprolol tartrate (LOPRESSOR) 25 MG tablet Take 0.5 tablets (12.5 mg total) by mouth  daily as needed. 30 tablet 0   No current facility-administered medications on file prior to visit.    BP 126/82   Pulse 99   Temp (!) 97.2 F (36.2 C) (Temporal)   Ht 5\' 4"  (1.626 m)   Wt 156 lb (70.8 kg)   SpO2 99%   BMI 26.78 kg/m  Objective:   Physical Exam HENT:     Right Ear: Tympanic membrane and ear canal normal.     Left Ear: Tympanic membrane and ear canal normal.     Nose: Nose normal.  Eyes:     Conjunctiva/sclera: Conjunctivae normal.     Pupils: Pupils are equal, round, and reactive to light.  Neck:     Thyroid: No thyromegaly.  Cardiovascular:     Rate and Rhythm:  Normal rate and regular rhythm.     Heart sounds: No murmur heard. Pulmonary:     Effort: Pulmonary effort is normal.     Breath sounds: Normal breath sounds. No rales.  Abdominal:     General: Bowel sounds are normal.     Palpations: Abdomen is soft.     Tenderness: There is no abdominal tenderness.  Musculoskeletal:        General: No tenderness.     Cervical back: Neck supple.     Right lower leg: No edema.     Left lower leg: No edema.     Comments: Chronic altered gait and decrease in ROM. No further decline in function noted.  Unable to get up to the exam table.   Lymphadenopathy:     Cervical: No cervical adenopathy.  Skin:    General: Skin is warm and dry.     Findings: No rash.  Neurological:     Mental Status: She is alert and oriented to person, place, and time.     Cranial Nerves: No cranial nerve deficit.     Deep Tendon Reflexes: Reflexes are normal and symmetric.  Psychiatric:        Mood and Affect: Mood normal.          Assessment & Plan:      This visit occurred during the SARS-CoV-2 public health emergency.  Safety protocols were in place, including screening questions prior to the visit, additional usage of staff PPE, and extensive cleaning of exam room while observing appropriate contact time as indicated for disinfecting solutions.

## 2020-10-04 NOTE — Assessment & Plan Note (Signed)
Compliant to vitamin D 5000 IU daily. Repeat labs pending.

## 2020-10-04 NOTE — Assessment & Plan Note (Signed)
Repeat A1C pending. 

## 2020-10-17 NOTE — Addendum Note (Signed)
Addended by: Donnamarie Poag on: 10/17/2020 11:39 AM   Modules accepted: Orders

## 2020-10-25 ENCOUNTER — Other Ambulatory Visit: Payer: Self-pay | Admitting: Primary Care

## 2020-10-25 DIAGNOSIS — I10 Essential (primary) hypertension: Secondary | ICD-10-CM

## 2020-11-15 ENCOUNTER — Other Ambulatory Visit: Payer: Self-pay

## 2020-11-15 ENCOUNTER — Ambulatory Visit
Admission: RE | Admit: 2020-11-15 | Discharge: 2020-11-15 | Disposition: A | Discharge status: Home / Self Care | Payer: Managed Care, Other (non HMO) | Source: Ambulatory Visit | Attending: Primary Care | Admitting: Primary Care

## 2020-11-15 DIAGNOSIS — Z1231 Encounter for screening mammogram for malignant neoplasm of breast: Secondary | ICD-10-CM | POA: Diagnosis present

## 2020-11-15 DIAGNOSIS — E2839 Other primary ovarian failure: Secondary | ICD-10-CM | POA: Insufficient documentation

## 2020-12-19 ENCOUNTER — Telehealth: Payer: Self-pay | Admitting: Primary Care

## 2020-12-19 NOTE — Telephone Encounter (Signed)
Ppw not received yet.

## 2020-12-19 NOTE — Telephone Encounter (Signed)
Pt has called to inform us she will be faxing over an ADA accommodation form  Pt has asked to receive a call back to discuss this form.

## 2020-12-26 NOTE — Telephone Encounter (Signed)
Received form placed in your box for review.

## 2020-12-27 NOTE — Telephone Encounter (Signed)
Patient is asking for accommodations to be able to work from home for following reasons: Limited mobility not able to walk more than 10 feet at a time Inability to climb stares Unable to carry equipment for work inside due to mobility and use of cain.

## 2020-12-27 NOTE — Telephone Encounter (Signed)
I'm reviewing her accomodation form and need to know what she's requesting accomodation for. Job functions? Not sure.

## 2021-01-01 NOTE — Telephone Encounter (Signed)
Left message to return call to our office.  Need to know if she wants to fax and if so where or if she wants to come by and pick up.

## 2021-01-01 NOTE — Telephone Encounter (Signed)
Completed and placed in [name]'s inbox. 

## 2021-01-01 NOTE — Telephone Encounter (Signed)
Pt  states that she would like the form faxed to [phone].

## 2021-01-04 NOTE — Telephone Encounter (Signed)
Ppw faxed  

## 2021-07-15 ENCOUNTER — Other Ambulatory Visit: Payer: Self-pay | Admitting: Primary Care

## 2021-07-15 DIAGNOSIS — I10 Essential (primary) hypertension: Secondary | ICD-10-CM

## 2021-10-12 ENCOUNTER — Telehealth: Payer: Self-pay | Admitting: Primary Care

## 2021-10-12 DIAGNOSIS — I10 Essential (primary) hypertension: Secondary | ICD-10-CM

## 2021-10-13 NOTE — Telephone Encounter (Signed)
Patient is due for CPE/follow up now, this will be required prior to any further refills.  Please schedule.   

## 2021-10-14 NOTE — Telephone Encounter (Signed)
Called pt, scheduled cpe for 10/31/21 

## 2021-10-31 ENCOUNTER — Ambulatory Visit (INDEPENDENT_AMBULATORY_CARE_PROVIDER_SITE_OTHER): Payer: BC Managed Care – PPO | Admitting: Primary Care

## 2021-10-31 ENCOUNTER — Encounter: Payer: Self-pay | Admitting: Primary Care

## 2021-10-31 VITALS — BP 138/82 | HR 97 | Temp 97.3°F | Ht 64.0 in | Wt 154.0 lb

## 2021-10-31 DIAGNOSIS — Z0000 Encounter for general adult medical examination without abnormal findings: Secondary | ICD-10-CM

## 2021-10-31 DIAGNOSIS — R7303 Prediabetes: Secondary | ICD-10-CM

## 2021-10-31 DIAGNOSIS — Z800 Family history of malignant neoplasm of digestive organs: Secondary | ICD-10-CM

## 2021-10-31 DIAGNOSIS — I10 Essential (primary) hypertension: Secondary | ICD-10-CM

## 2021-10-31 DIAGNOSIS — Z1211 Encounter for screening for malignant neoplasm of colon: Secondary | ICD-10-CM

## 2021-10-31 DIAGNOSIS — G7100 Muscular dystrophy, unspecified: Secondary | ICD-10-CM

## 2021-10-31 DIAGNOSIS — Z23 Encounter for immunization: Secondary | ICD-10-CM

## 2021-10-31 DIAGNOSIS — R000 Tachycardia, unspecified: Secondary | ICD-10-CM

## 2021-10-31 DIAGNOSIS — R053 Chronic cough: Secondary | ICD-10-CM

## 2021-10-31 HISTORY — DX: Chronic cough: R05.3

## 2021-10-31 LAB — LIPID PANEL
Cholesterol: 204 mg/dL — ABNORMAL HIGH (ref 0–200)
HDL: 81.5 mg/dL (ref >39.00)
LDL Cholesterol: 102 mg/dL — ABNORMAL HIGH (ref 0–99)
NonHDL: 122.02
Total CHOL/HDL Ratio: 2
Triglycerides: 100 mg/dL (ref 0.0–149.0)
VLDL: 20 mg/dL (ref 0.0–40.0)

## 2021-10-31 LAB — COMPREHENSIVE METABOLIC PANEL
ALT: 38 U/L — ABNORMAL HIGH (ref 0–35)
AST: 38 U/L — ABNORMAL HIGH (ref 0–37)
Albumin: 4.5 g/dL (ref 3.5–5.2)
Alkaline Phosphatase: 70 U/L (ref 39–117)
BUN: 7 mg/dL (ref 6–23)
CO2: 30 mEq/L (ref 19–32)
Calcium: 9.9 mg/dL (ref 8.4–10.5)
Chloride: 100 mEq/L (ref 96–112)
Creatinine, Ser: 0.45 mg/dL (ref 0.40–1.20)
GFR: 100.07 mL/min (ref >60.00)
Glucose, Bld: 119 mg/dL — ABNORMAL HIGH (ref 70–99)
Potassium: 4 mEq/L (ref 3.5–5.1)
Sodium: 140 mEq/L (ref 135–145)
Total Bilirubin: 1 mg/dL (ref 0.2–1.2)
Total Protein: 7.3 g/dL (ref 6.0–8.3)

## 2021-10-31 LAB — HEMOGLOBIN A1C: Hgb A1c MFr Bld: 5.6 % (ref 4.6–6.5)

## 2021-10-31 MED ORDER — LOSARTAN POTASSIUM 50 MG PO TABS: 50.0000 mg | ORAL_TABLET | Freq: Every day | ORAL | 0 refills | Status: DC

## 2021-10-31 NOTE — Assessment & Plan Note (Addendum)
Borderline high, recommended she check BP at home. Also with suspected ACE-I induced cough.  Continue amlodipine 10 mg daily. Stop benazepril 20 mg. Start losartan 50 mg daily.   CMP pending.  She will update regarding BP readings.

## 2021-10-31 NOTE — Assessment & Plan Note (Signed)
Prevnar 20 and influenza vaccines due and provided. Colon cancer screening overdue, she opts for Cologuard which will be sent to her home. She would like to defer mammogram until next year. Bone density scan due in 2024   Discussed the importance of a healthy diet and regular exercise in order for weight loss, and to reduce the risk of further co-morbidity.  Exam stable. Labs pending.  Follow up in 1 year for repeat physical.

## 2021-10-31 NOTE — Assessment & Plan Note (Signed)
Controlled.  Continue metoprolol tartrate 12.5 mg daily PRN.

## 2021-10-31 NOTE — Patient Instructions (Signed)
Stop by the lab prior to leaving today. I will notify you of your results once received.   Please complete the Cologuard Kit once received.   Stop benazepril for blood pressure.  Start losartan 50 mg tablets once daily for blood pressure.  Monitor your blood pressure and notify me if you see readings that are consistently at or above 140 on top and/or 90 on bottom.  It was a pleasure to see you today!  Preventive Care 32 Years and Older, Female Preventive care refers to lifestyle choices and visits with your health care provider that can promote health and wellness. Preventive care visits are also called wellness exams. What can I expect for my preventive care visit? Counseling Your health care provider may ask you questions about your: Medical history, including: Past medical problems. Family medical history. Pregnancy and menstrual history. History of falls. Current health, including: Memory and ability to understand (cognition). Emotional well-being. Home life and relationship well-being. Sexual activity and sexual health. Lifestyle, including: Alcohol, nicotine or tobacco, and drug use. Access to firearms. Diet, exercise, and sleep habits. Work and work Statistician. Sunscreen use. Safety issues such as seatbelt and bike helmet use. Physical exam Your health care provider will check your: Height and weight. These may be used to calculate your BMI (body mass index). BMI is a measurement that tells if you are at a healthy weight. Waist circumference. This measures the distance around your waistline. This measurement also tells if you are at a healthy weight and may help predict your risk of certain diseases, such as type 2 diabetes and high blood pressure. Heart rate and blood pressure. Body temperature. Skin for abnormal spots. What immunizations do I need?  Vaccines are usually given at various ages, according to a schedule. Your health care provider will recommend  vaccines for you based on your age, medical history, and lifestyle or other factors, such as travel or where you work. What tests do I need? Screening Your health care provider may recommend screening tests for certain conditions. This may include: Lipid and cholesterol levels. Hepatitis C test. Hepatitis B test. HIV (human immunodeficiency virus) test. STI (sexually transmitted infection) testing, if you are at risk. Lung cancer screening. Colorectal cancer screening. Diabetes screening. This is done by checking your blood sugar (glucose) after you have not eaten for a while (fasting). Mammogram. Talk with your health care provider about how often you should have regular mammograms. BRCA-related cancer screening. This may be done if you have a family history of breast, ovarian, tubal, or peritoneal cancers. Bone density scan. This is done to screen for osteoporosis. Talk with your health care provider about your test results, treatment options, and if necessary, the need for more tests. Follow these instructions at home: Eating and drinking  Eat a diet that includes fresh fruits and vegetables, whole grains, lean protein, and low-fat dairy products. Limit your intake of foods with high amounts of sugar, saturated fats, and salt. Take vitamin and mineral supplements as recommended by your health care provider. Do not drink alcohol if your health care provider tells you not to drink. If you drink alcohol: Limit how much you have to 0-1 drink a day. Know how much alcohol is in your drink. In the [place] one drink equals one 12 oz bottle of beer (355 mL), one 5 oz glass of wine (148 mL), or one 1 oz glass of hard liquor (44 mL). Lifestyle Brush your teeth every morning and night with fluoride toothpaste. Floss  one time each day. Exercise for at least 30 minutes 5 or more days each week. Do not use any products that contain nicotine or tobacco. These products include cigarettes, chewing  tobacco, and vaping devices, such as e-cigarettes. If you need help quitting, ask your health care provider. Do not use drugs. If you are sexually active, practice safe sex. Use a condom or other form of protection in order to prevent STIs. Take aspirin only as told by your health care provider. Make sure that you understand how much to take and what form to take. Work with your health care provider to find out whether it is safe and beneficial for you to take aspirin daily. Ask your health care provider if you need to take a cholesterol-lowering medicine (statin). Find healthy ways to manage stress, such as: Meditation, yoga, or listening to music. Journaling. Talking to a trusted person. Spending time with friends and family. Minimize exposure to UV radiation to reduce your risk of skin cancer. Safety Always wear your seat belt while driving or riding in a vehicle. Do not drive: If you have been drinking alcohol. Do not ride with someone who has been drinking. When you are tired or distracted. While texting. If you have been using any mind-altering substances or drugs. Wear a helmet and other protective equipment during sports activities. If you have firearms in your house, make sure you follow all gun safety procedures. What's next? Visit your health care provider once a year for an annual wellness visit. Ask your health care provider how often you should have your eyes and teeth checked. Stay up to date on all vaccines. This information is not intended to replace advice given to you by your health care provider. Make sure you discuss any questions you have with your health care provider. Document Revised: 07/11/2020 Document Reviewed: 07/11/2020 Elsevier Patient Education  [place].

## 2021-10-31 NOTE — Assessment & Plan Note (Signed)
She does not appear acutely ill. Could be GERD, although she denies current symptoms. Could also be exacerbated by allergies.'  Suspect ACE induced cough, discussed with patient today.  Stop benazepril, start losartan. She will update in a few weeks.

## 2021-10-31 NOTE — Assessment & Plan Note (Signed)
Repeat A1C pending. 

## 2021-10-31 NOTE — Assessment & Plan Note (Addendum)
Slightly deteriorated, also more sedentary as she works from home. Strongly advised she consider PT as this was recommended by neurology last year.  She kindly declines but will notify when ready to pursue PT vs neurology follow up.  Recommended daily physical activity

## 2021-10-31 NOTE — Addendum Note (Signed)
Addended by: Pat Kocher on: 10/31/2021 09:26 AM   Modules accepted: Orders

## 2021-10-31 NOTE — Progress Notes (Signed)
Subjective:    Patient ID: Jeanne Taylor, female    DOB: [birth year], 66 y.o.   MRN: [id number]  HPI  Quetzaly Ebner is a very pleasant 66 y.o. female who presents today for complete physical and follow up of chronic conditions.  She would also like to discuss dry cough. Her cough occurs intermittently during the day and night, mostly at night, improves when sitting up. She does notice post nasal drip and right ear that began a few days ago. Cough began about 2 months ago. She denies fevers, chills, esophageal burning. She is managed on benazepril for for which she's taken for years.   Immunizations: -Tetanus: 2019 -Influenza: Due today -Shingles: Completed Shingrix -Pneumonia: Never completed   Diet: Fair diet.  Exercise: No regular exercise.  Eye exam: Completes annually  Dental exam: Completes semi-annually   Mammogram: Completed in October 2022, declines today  Colonoscopy: Never completed, never completed Cologuard that was sent last year. Dexa: Completed in October 2022  BP Readings from Last 3 Encounters:  10/31/21 138/82  10/04/20 126/82  10/11/19 130/74        Review of Systems  Constitutional:  Negative for unexpected weight change.  HENT:  Negative for rhinorrhea.   Respiratory:  Negative for cough and shortness of breath.   Cardiovascular:  Negative for chest pain.  Gastrointestinal:  Negative for constipation and diarrhea.  Genitourinary:  Negative for difficulty urinating.  Musculoskeletal:  Negative for arthralgias and myalgias.  Skin:  Negative for rash.  Allergic/Immunologic: Negative for environmental allergies.  Neurological:  Negative for dizziness and headaches.       Unsteady gait, chronic   Psychiatric/Behavioral:  The patient is not nervous/anxious.          Past Medical History:  Diagnosis Date   Anxiety    Hyperlipidemia    Hypertension    Muscular dystrophy (HCC)     Social History   Socioeconomic History   Marital status:  Divorced    Spouse name: Not on file   Number of children: Not on file   Years of education: Not on file   Highest education level: Not on file  Occupational History   Not on file  Tobacco Use   Smoking status: Never   Smokeless tobacco: Never  Vaping Use   Vaping Use: Never used  Substance and Sexual Activity   Alcohol use: Yes    Alcohol/week: 0.0 standard drinks of alcohol    Comment: social   Drug use: No   Sexual activity: Not on file  Other Topics Concern   Not on file  Social History Narrative   Single.   1 daughter, adopted.   No grandchildren.   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   Lives at home with her Harlow Mares.    Enjoys cooking, cleaning, decorating.    Social Determinants of Health   Financial Resource Strain: Not on file  Food Insecurity: Not on file  Transportation Needs: Not on file  Physical Activity: Not on file  Stress: Not on file  Social Connections: Not on file  Intimate Partner Violence: Not on file    Past Surgical History:  Procedure Laterality Date   BUNIONECTOMY Right     Family History  Problem Relation Age of Onset   Colon cancer Mother    Hypertension Father    Muscular dystrophy Brother     No Known Allergies  Current Outpatient Medications on File Prior to Visit  Medication Sig Dispense Refill   amLODipine (NORVASC)  10 MG tablet TAKE 1 TABLET BY MOUTH EVERY DAY FOR BLOOD PRESSURE. OFFICE VISIT REQUIRED FOR FURTHER REFILLS. 30 tablet 0   metoprolol tartrate (LOPRESSOR) 25 MG tablet Take 0.5 tablets (12.5 mg total) by mouth daily as needed. 30 tablet 0   VITAMIN D PO Take by mouth.     No current facility-administered medications on file prior to visit.    BP 138/82   Pulse 97   Temp (!) 97.3 F (36.3 C) (Temporal)   Ht 5\' 4"  (1.626 m)   Wt 154 lb (69.9 kg)   SpO2 100%   BMI 26.43 kg/m  Objective:   Physical Exam HENT:     Right Ear: Tympanic membrane and ear canal normal.     Left Ear: Tympanic membrane and ear canal  normal.     Nose: Nose normal.  Eyes:     Conjunctiva/sclera: Conjunctivae normal.     Pupils: Pupils are equal, round, and reactive to light.  Neck:     Thyroid: No thyromegaly.  Cardiovascular:     Rate and Rhythm: Normal rate and regular rhythm.     Heart sounds: No murmur heard. Pulmonary:     Effort: Pulmonary effort is normal.     Breath sounds: Normal breath sounds. No rales.  Abdominal:     General: Bowel sounds are normal.     Palpations: Abdomen is soft.     Tenderness: There is no abdominal tenderness.  Musculoskeletal:     Cervical back: Neck supple.     Comments: Unsteady gait without assistive device, improved with cane. Unable to get up and down from exam table.   Lymphadenopathy:     Cervical: No cervical adenopathy.  Skin:    General: Skin is warm and dry.     Findings: No rash.  Neurological:     Mental Status: She is alert and oriented to person, place, and time.     Cranial Nerves: No cranial nerve deficit.     Deep Tendon Reflexes: Reflexes are normal and symmetric.  Psychiatric:        Mood and Affect: Mood normal.           Assessment & Plan:   Problem List Items Addressed This Visit       Cardiovascular and Mediastinum   Hypertension    Borderline high, recommended she check BP at home. Also with suspected ACE-I induced cough.  Continue amlodipine 10 mg daily. Stop benazepril 20 mg. Start losartan 50 mg daily.   CMP pending.  She will update regarding BP readings.       Relevant Medications   losartan (COZAAR) 50 MG tablet     Musculoskeletal and Integument   Muscular dystrophy (HCC)    Slightly deteriorated, also more sedentary as she works from home. Strongly advised she consider PT as this was recommended by neurology last year.  She kindly declines but will notify when ready to pursue PT vs neurology follow up.  Recommended daily physical activity        Other   Family history of colon cancer    She again declines  colonoscopy, she also never completed Cologuard that was sent out in 2020. She agrees to try Cologuard this year. Orders placed.       Preventative health care - Primary    Prevnar 20 and influenza vaccines due and provided. Colon cancer screening overdue, she opts for Cologuard which will be sent to her home. She would like to defer mammogram  until next year. Bone density scan due in 2024   Discussed the importance of a healthy diet and regular exercise in order for weight loss, and to reduce the risk of further co-morbidity.  Exam stable. Labs pending.  Follow up in 1 year for repeat physical.       Sinus tachycardia    Controlled.  Continue metoprolol tartrate 12.5 mg daily PRN.      Prediabetes    Repeat A1C pending.      Relevant Orders   Hemoglobin A1c   Persistent cough for 3 weeks or longer    She does not appear acutely ill. Could be GERD, although she denies current symptoms. Could also be exacerbated by allergies.'  Suspect ACE induced cough, discussed with patient today.  Stop benazepril, start losartan. She will update in a few weeks.      Other Visit Diagnoses     Essential hypertension       Relevant Medications   losartan (COZAAR) 50 MG tablet   Other Relevant Orders   Lipid panel   Comprehensive metabolic panel   Screening for colon cancer       Relevant Orders   Cologuard          Pleas Koch, NP

## 2021-10-31 NOTE — Assessment & Plan Note (Signed)
She again declines colonoscopy, she also never completed Cologuard that was sent out in 2020. She agrees to try Cologuard this year. Orders placed.

## 2021-11-08 ENCOUNTER — Other Ambulatory Visit: Payer: Self-pay | Admitting: Primary Care

## 2021-11-08 DIAGNOSIS — I10 Essential (primary) hypertension: Secondary | ICD-10-CM

## 2021-11-12 ENCOUNTER — Other Ambulatory Visit: Payer: Self-pay | Admitting: Primary Care

## 2021-11-12 DIAGNOSIS — I10 Essential (primary) hypertension: Secondary | ICD-10-CM

## 2022-01-26 ENCOUNTER — Other Ambulatory Visit: Payer: Self-pay | Admitting: Primary Care

## 2022-01-26 DIAGNOSIS — I10 Essential (primary) hypertension: Secondary | ICD-10-CM

## 2022-06-03 DIAGNOSIS — H35033 Hypertensive retinopathy, bilateral: Secondary | ICD-10-CM | POA: Diagnosis not present

## 2022-10-29 ENCOUNTER — Other Ambulatory Visit: Payer: Self-pay | Admitting: Primary Care

## 2022-10-29 DIAGNOSIS — I10 Essential (primary) hypertension: Secondary | ICD-10-CM

## 2022-10-29 NOTE — Telephone Encounter (Signed)
Patient is due for CPE/follow up in mid October, this will be required prior to any further refills.  Please schedule, thank you!

## 2022-10-29 NOTE — Telephone Encounter (Signed)
Patient has been scheduled

## 2022-11-06 ENCOUNTER — Encounter: Payer: Self-pay | Admitting: Primary Care

## 2022-11-06 ENCOUNTER — Ambulatory Visit: Payer: BC Managed Care – PPO | Admitting: Primary Care

## 2022-11-06 VITALS — BP 146/86 | HR 92 | Temp 96.8°F | Ht 64.0 in | Wt 167.0 lb

## 2022-11-06 DIAGNOSIS — G7100 Muscular dystrophy, unspecified: Secondary | ICD-10-CM

## 2022-11-06 DIAGNOSIS — R7303 Prediabetes: Secondary | ICD-10-CM

## 2022-11-06 DIAGNOSIS — Z0000 Encounter for general adult medical examination without abnormal findings: Secondary | ICD-10-CM

## 2022-11-06 DIAGNOSIS — R000 Tachycardia, unspecified: Secondary | ICD-10-CM

## 2022-11-06 DIAGNOSIS — I10 Essential (primary) hypertension: Secondary | ICD-10-CM | POA: Diagnosis not present

## 2022-11-06 DIAGNOSIS — M545 Low back pain, unspecified: Secondary | ICD-10-CM

## 2022-11-06 DIAGNOSIS — Z800 Family history of malignant neoplasm of digestive organs: Secondary | ICD-10-CM

## 2022-11-06 DIAGNOSIS — G8929 Other chronic pain: Secondary | ICD-10-CM

## 2022-11-06 DIAGNOSIS — J309 Allergic rhinitis, unspecified: Secondary | ICD-10-CM

## 2022-11-06 DIAGNOSIS — Z23 Encounter for immunization: Secondary | ICD-10-CM | POA: Diagnosis not present

## 2022-11-06 LAB — COMPREHENSIVE METABOLIC PANEL
ALT: 34 U/L (ref 0–35)
AST: 30 U/L (ref 0–37)
Albumin: 4.3 g/dL (ref 3.5–5.2)
Alkaline Phosphatase: 65 U/L (ref 39–117)
BUN: 6 mg/dL (ref 6–23)
CO2: 31 meq/L (ref 19–32)
Calcium: 9.8 mg/dL (ref 8.4–10.5)
Chloride: 101 meq/L (ref 96–112)
Creatinine, Ser: 0.41 mg/dL (ref 0.40–1.20)
GFR: 101.61 mL/min (ref >60.00)
Glucose, Bld: 134 mg/dL — ABNORMAL HIGH (ref 70–99)
Potassium: 3.9 meq/L (ref 3.5–5.1)
Sodium: 143 meq/L (ref 135–145)
Total Bilirubin: 1 mg/dL (ref 0.2–1.2)
Total Protein: 6.5 g/dL (ref 6.0–8.3)

## 2022-11-06 LAB — LIPID PANEL
Cholesterol: 200 mg/dL (ref 0–200)
HDL: 77.6 mg/dL (ref >39.00)
LDL Cholesterol: 102 mg/dL — ABNORMAL HIGH (ref 0–99)
NonHDL: 122.8
Total CHOL/HDL Ratio: 3
Triglycerides: 106 mg/dL (ref 0.0–149.0)
VLDL: 21.2 mg/dL (ref 0.0–40.0)

## 2022-11-06 LAB — CBC
HCT: 46 % (ref 36.0–46.0)
Hemoglobin: 15.2 g/dL — ABNORMAL HIGH (ref 12.0–15.0)
MCHC: 33 g/dL (ref 30.0–36.0)
MCV: 103.6 fL — ABNORMAL HIGH (ref 78.0–100.0)
Platelets: 250 10*3/uL (ref 150.0–400.0)
RBC: 4.44 Mil/uL (ref 3.87–5.11)
RDW: 13.9 % (ref 11.5–15.5)
WBC: 8.7 10*3/uL (ref 4.0–10.5)

## 2022-11-06 LAB — HEMOGLOBIN A1C: Hgb A1c MFr Bld: 5.5 % (ref 4.6–6.5)

## 2022-11-06 MED ORDER — ETODOLAC 400 MG PO TABS
400.0000 mg | ORAL_TABLET | Freq: Two times a day (BID) | ORAL | 0 refills | Status: AC | PRN
Start: 2022-11-06 — End: ?

## 2022-11-06 NOTE — Assessment & Plan Note (Signed)
Immunizations UTD. Influenza vaccine provided today.  Mammogram and bone density scan due, she declines today. Colonoscopy overdue, she continues to decline colonoscopy and Cologuard.  Discussed the importance of a healthy diet and regular exercise in order for weight loss, and to reduce the risk of further co-morbidity.  Exam stable. Labs pending.  Follow up in 1 year for repeat physical.

## 2022-11-06 NOTE — Patient Instructions (Addendum)
Stop by the lab prior to leaving today. I will notify you of your results once received.   Please think about completing the mammogram, bone density scan, and colon cancer screening.  Please also think about physical therapy.  Start monitoring your blood pressure daily, around the same time of day, for the next 2-3 weeks.  Ensure that you have rested for 30 minutes prior to checking your blood pressure.   Record your readings and notify me if you see numbers consistently at or above 140 on top and/or 90 on bottom.   It was a pleasure to see you today!

## 2022-11-06 NOTE — Assessment & Plan Note (Signed)
Stable.  Refills provided for etodolac 400 mg PRN.

## 2022-11-06 NOTE — Assessment & Plan Note (Signed)
Repeat A1C pending.  Discussed the importance of a healthy diet and regular exercise in order for weight loss, and to reduce the risk of further co-morbidity.  

## 2022-11-06 NOTE — Assessment & Plan Note (Signed)
Stable.  Continue metoprolol tartrate 12.5 mg as needed.

## 2022-11-06 NOTE — Assessment & Plan Note (Signed)
Strongly advised she proceed with colon cancer screening with colonoscopy. She declines. Offered Cologuard, she declines but will think about it.

## 2022-11-06 NOTE — Assessment & Plan Note (Signed)
Above goal today, also on recheck.   I recommended that she start monitoring her blood pressure at home and report if readings are consistently at or above 140/90.  For now, continue amlodipine 10 mg daily, losartan 50 mg daily. CMP pending.

## 2022-11-06 NOTE — Assessment & Plan Note (Signed)
Overall stable per patient. Some imbalance, no falls.   Offered PT referral for which she declines today. She will check on insurance coverage and update.

## 2022-11-06 NOTE — Progress Notes (Signed)
Subjective:    Patient ID: Jeanne Taylor, female    DOB: 01/03/1956, 67 y.o.   MRN: [id number]  HPI  Jeanne Taylor is a very pleasant 67 y.o. female who presents today for complete physical and follow up of chronic conditions.  Immunizations: -Tetanus: Completed in 2019 -Influenza: Provided today -Shingles: Completed Shingrix series -Pneumonia: Completed Prevnar 20 in 2023  Diet: Fair diet.  Exercise: No regular exercise.  Eye exam: Completes annually  Dental exam: Completes semi-annually    Mammogram: Completed in October 2022, declines today Bone Density Scan: Completed in October 2022, declines today  Colonoscopy: Never completed colonoscopy or Cologuard. Declines today.  BP Readings from Last 3 Encounters:  11/06/22 (!) 146/86  10/31/21 138/82  10/04/20 126/82      Review of Systems  Constitutional:  Negative for unexpected weight change.  HENT:  Negative for rhinorrhea.   Respiratory:  Negative for cough and shortness of breath.   Cardiovascular:  Negative for chest pain.  Gastrointestinal:  Negative for constipation and diarrhea.  Genitourinary:  Negative for difficulty urinating.  Musculoskeletal:  Positive for arthralgias. Negative for myalgias.  Skin:  Negative for rash.  Allergic/Immunologic: Negative for environmental allergies.  Neurological:  Negative for dizziness, numbness and headaches.  Psychiatric/Behavioral:  The patient is not nervous/anxious.          Past Medical History:  Diagnosis Date   Anxiety    Hyperlipidemia    Hypertension    Muscular dystrophy (HCC)     Social History   Socioeconomic History   Marital status: Divorced    Spouse name: Not on file   Number of children: Not on file   Years of education: Not on file   Highest education level: Not on file  Occupational History   Not on file  Tobacco Use   Smoking status: Never   Smokeless tobacco: Never  Vaping Use   Vaping status: Never Used  Substance and Sexual  Activity   Alcohol use: Yes    Alcohol/week: 0.0 standard drinks of alcohol    Comment: social   Drug use: No   Sexual activity: Not on file  Other Topics Concern   Not on file  Social History Narrative   Single.   1 daughter, adopted.   No grandchildren.   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   Lives at home with her Harlow Mares.    Enjoys cooking, cleaning, decorating.    Social Determinants of Health   Financial Resource Strain: Not on file  Food Insecurity: Not on file  Transportation Needs: Not on file  Physical Activity: Not on file  Stress: Not on file  Social Connections: Not on file  Intimate Partner Violence: Not on file    Past Surgical History:  Procedure Laterality Date   BUNIONECTOMY Right     Family History  Problem Relation Age of Onset   Colon cancer Mother    Hypertension Father    Muscular dystrophy Brother     No Known Allergies  Current Outpatient Medications on File Prior to Visit  Medication Sig Dispense Refill   amLODipine (NORVASC) 10 MG tablet TAKE 1 TABLET BY MOUTH EVERY DAY FOR BLOOD PRESSURE 30 tablet 0   losartan (COZAAR) 50 MG tablet TAKE 1 TABLET (50 MG TOTAL) BY MOUTH DAILY. FOR BLOOD PRESSURE. 30 tablet 0   VITAMIN D PO Take by mouth.     metoprolol tartrate (LOPRESSOR) 25 MG tablet Take 0.5 tablets (12.5 mg total) by mouth daily as needed. (  Patient not taking: Reported on 11/06/2022) 30 tablet 0   No current facility-administered medications on file prior to visit.    BP (!) 146/86   Pulse 92   Temp (!) 96.8 F (36 C) (Temporal)   Ht 5\' 4"  (1.626 m)   Wt 167 lb (75.8 kg)   SpO2 97%   BMI 28.67 kg/m  Objective:   Physical Exam HENT:     Right Ear: Tympanic membrane and ear canal normal.     Left Ear: Tympanic membrane and ear canal normal.  Eyes:     Pupils: Pupils are equal, round, and reactive to light.  Cardiovascular:     Rate and Rhythm: Normal rate and regular rhythm.  Pulmonary:     Effort: Pulmonary effort is normal.      Breath sounds: Normal breath sounds.  Abdominal:     General: Bowel sounds are normal.     Palpations: Abdomen is soft.     Tenderness: There is no abdominal tenderness.  Musculoskeletal:        General: Normal range of motion.     Cervical back: Neck supple.     Comments: Ambulation stable in clinic with cane  Skin:    General: Skin is warm and dry.  Neurological:     Mental Status: She is alert and oriented to person, place, and time.     Cranial Nerves: No cranial nerve deficit.     Deep Tendon Reflexes:     Reflex Scores:      Patellar reflexes are 2+ on the right side and 2+ on the left side. Psychiatric:        Mood and Affect: Mood normal.           Assessment & Plan:  Preventative health care Assessment & Plan: Immunizations UTD. Influenza vaccine provided today.  Mammogram and bone density scan due, she declines today. Colonoscopy overdue, she continues to decline colonoscopy and Cologuard.  Discussed the importance of a healthy diet and regular exercise in order for weight loss, and to reduce the risk of further co-morbidity.  Exam stable. Labs pending.  Follow up in 1 year for repeat physical.    Muscular dystrophy (HCC) Assessment & Plan: Overall stable per patient. Some imbalance, no falls.   Offered PT referral for which she declines today. She will check on insurance coverage and update.    Prediabetes Assessment & Plan: Repeat A1C pending.  Discussed the importance of a healthy diet and regular exercise in order for weight loss, and to reduce the risk of further co-morbidity.   Orders: -     Lipid panel -     Hemoglobin A1c -     Comprehensive metabolic panel -     CBC  Family history of colon cancer Assessment & Plan: Strongly advised she proceed with colon cancer screening with colonoscopy. She declines. Offered Cologuard, she declines but will think about it.   Chronic low back pain without sciatica, unspecified back pain  laterality Assessment & Plan: Stable.  Refills provided for etodolac 400 mg PRN.   Orders: -     Etodolac; Take 1 tablet (400 mg total) by mouth 2 (two) times daily as needed for moderate pain.  Dispense: 60 tablet; Refill: 0  Allergic rhinitis, unspecified seasonality, unspecified trigger Assessment & Plan: Stable.  Continue Claritin OTC as needed.   Primary hypertension Assessment & Plan: Above goal today, also on recheck.   I recommended that she start monitoring her blood pressure  at home and report if readings are consistently at or above 140/90.  For now, continue amlodipine 10 mg daily, losartan 50 mg daily. CMP pending.  Orders: -     Lipid panel -     Comprehensive metabolic panel -     CBC  Sinus tachycardia Assessment & Plan: Stable.  Continue metoprolol tartrate 12.5 mg as needed.   Encounter for immunization -     Flu Vaccine Trivalent High Dose (Fluad)        Doreene Nest, NP

## 2022-11-06 NOTE — Assessment & Plan Note (Signed)
Stable.  Continue Claritin OTC as needed.

## 2022-11-24 ENCOUNTER — Other Ambulatory Visit: Payer: Self-pay | Admitting: Primary Care

## 2022-11-24 DIAGNOSIS — I10 Essential (primary) hypertension: Secondary | ICD-10-CM

## 2023-02-13 ENCOUNTER — Telehealth: Payer: Self-pay | Admitting: Primary Care

## 2023-02-13 NOTE — Telephone Encounter (Signed)
I do not see where I have prescribed this before.  Where did she get the Rx from?

## 2023-02-13 NOTE — Telephone Encounter (Signed)
They said at [facility] directed them here.

## 2023-02-13 NOTE — Telephone Encounter (Signed)
Prescription Request  02/13/2023  LOV: 11/06/2022  What is the name of the medication or equipment? Ipratropium Bromide Nasal Solution 0.06 %  Have you contacted your pharmacy to request a refill? No   Which pharmacy would you like this sent to?  [facility] [address], [place] - [address] [place] [address] Phone: [phone] Fax: [phone]    Patient notified that their request is being sent to the clinical staff for review and that they should receive a response within 2 business days.   Please advise at Mobile [phone] (mobile)

## 2023-02-13 NOTE — Telephone Encounter (Signed)
Unable to reach patient. Left voicemail to return call to our office.   

## 2023-02-16 NOTE — Telephone Encounter (Signed)
Noted  

## 2023-02-16 NOTE — Telephone Encounter (Signed)
Called patient states that she is having sinus issues that are about the same. That is why she requested refill.  Advised patient that we would need to do office visit to start new medication. She will hold off until later this week if not feeling better will set up office visit.

## 2023-02-18 ENCOUNTER — Telehealth: Payer: BC Managed Care – PPO | Admitting: Physician Assistant

## 2023-02-18 DIAGNOSIS — B9789 Other viral agents as the cause of diseases classified elsewhere: Secondary | ICD-10-CM | POA: Diagnosis not present

## 2023-02-18 DIAGNOSIS — J019 Acute sinusitis, unspecified: Secondary | ICD-10-CM

## 2023-02-18 MED ORDER — BENZONATATE 100 MG PO CAPS
100.0000 mg | ORAL_CAPSULE | Freq: Three times a day (TID) | ORAL | 0 refills | Status: AC | PRN
Start: 2023-02-18 — End: ?

## 2023-02-18 MED ORDER — NAPROXEN 500 MG PO TABS
500.0000 mg | ORAL_TABLET | Freq: Two times a day (BID) | ORAL | 0 refills | Status: AC
Start: 2023-02-18 — End: ?

## 2023-02-18 MED ORDER — FLUTICASONE PROPIONATE 50 MCG/ACT NA SUSP
2.0000 | Freq: Every day | NASAL | 0 refills | Status: AC
Start: 2023-02-18 — End: ?

## 2023-02-18 NOTE — Progress Notes (Signed)
E-Visit  for Positive Covid Test Result   We are sorry you are not feeling well. We are here to help!  You have tested positive for COVID-19, meaning that you were infected with the novel coronavirus and could give the virus to others.  Most people with COVID-19 have mild illness and can recover at home without medical care. Do not leave your home, except to get medical care. Do not visit public areas and do not go to places where you are unable to wear a mask. It is important that you stay home  to take care for yourself and to help protect other people in your home and community.      Isolation Instructions:   You are to isolate at home until you have been fever free for at least 24 hours without a fever-reducing medication, and symptoms have been steadily improving for 24 hours. At that time,  you can end isolation but need to mask for an additional 5 days.  If you must be around other household members who do not have symptoms, you need to make sure that both you and the family members are masking consistently with a high-quality mask.  If you note any worsening of symptoms despite treatment, please seek an in-person evaluation ASAP. If you note any significant shortness of breath or any chest pain, please seek ER evaluation. Please do not delay care!   Go to the nearest hospital ED for assessment if fever/cough/breathlessness are severe or illness seems like a threat to life.    The following symptoms may appear 2-14 days after exposure: Fever Cough Shortness of breath or difficulty breathing Chills Repeated shaking with chills Muscle pain Headache Sore throat New loss of taste or smell Fatigue Congestion or runny nose Nausea or vomiting Diarrhea  You can use medication such as prescription cough medication called Tessalon Perles 100 mg. You may take 1-2 capsules every 8 hours as needed for cough, prescription anti-inflammatory called Naprosyn 500 mg. Take twice daily as  needed for fever or body aches for 2 weeks, and prescription for Fluticasone nasal spray 2 sprays in each nostril one time per day for nasal congestion.  You may also take acetaminophen (Tylenol) as needed for fever.  Having some streaked blood is normal due to inflammation of the nasal passageways and sinuses. The inflammation causes the tissue to be thin and tear more easily leading to blood in the mucus, especially with irritation like blowing the nose.   If the blood becomes more prominent you can add Afrin nasal spray OTC to help constrict blood vessels and lessen bleeding. Just do not use for more than 3 days consecutively.   HOME CARE: Only take medications as instructed by your medical team. Drink plenty of fluids and get plenty of rest. A steam or ultrasonic humidifier can help if you have congestion.   GET HELP RIGHT AWAY IF YOU HAVE EMERGENCY WARNING SIGNS.  Call 911 or proceed to your closest emergency facility if: You develop worsening high fever. Trouble breathing Bluish lips or face Persistent pain or pressure in the chest New confusion Inability to wake or stay awake You cough up blood. Your symptoms become more severe Inability to hold down food or fluids  This list is not all possible symptoms. Contact your medical provider for any symptoms that are severe or concerning to you.   Your e-visit answers were reviewed by a board certified advanced clinical practitioner to complete your personal care plan.  Depending on  the condition, your plan could have included both over the counter or prescription medications.  If there is a problem please reply once you have received a response from your provider.  Your safety is important to [place].  If you have drug allergies check your prescription carefully.    You can use MyChart to ask questions about today's visit, request a non-urgent call back, or ask for a work or school excuse for 24 hours related to this e-Visit. If it has  been greater than 24 hours you will need to follow up with your provider, or enter a new e-Visit to address those concerns. You will get an e-mail in the next two days asking about your experience.  I hope that your e-visit has been valuable and will speed your recovery. Thank you for using e-visits.     I have spent 5 minutes in review of e-visit questionnaire, review and updating patient chart, medical decision making and response to patient.   Margaretann Loveless, PA-C

## 2023-06-23 IMAGING — MG MM DIGITAL SCREENING BILAT W/ TOMO AND CAD
8 series · 8 of 24 positions shown · non-contrast
Comparison: Previous exam(s).

CLINICAL DATA: Screening.

EXAM:
DIGITAL SCREENING BILATERAL MAMMOGRAM WITH TOMOSYNTHESIS AND CAD
TECHNIQUE: Bilateral screening digital craniocaudal and mediolateral oblique
mammograms were obtained. Bilateral screening digital breast
tomosynthesis was performed. The images were evaluated with
computer-aided detection.

[R MLO synth-2D]
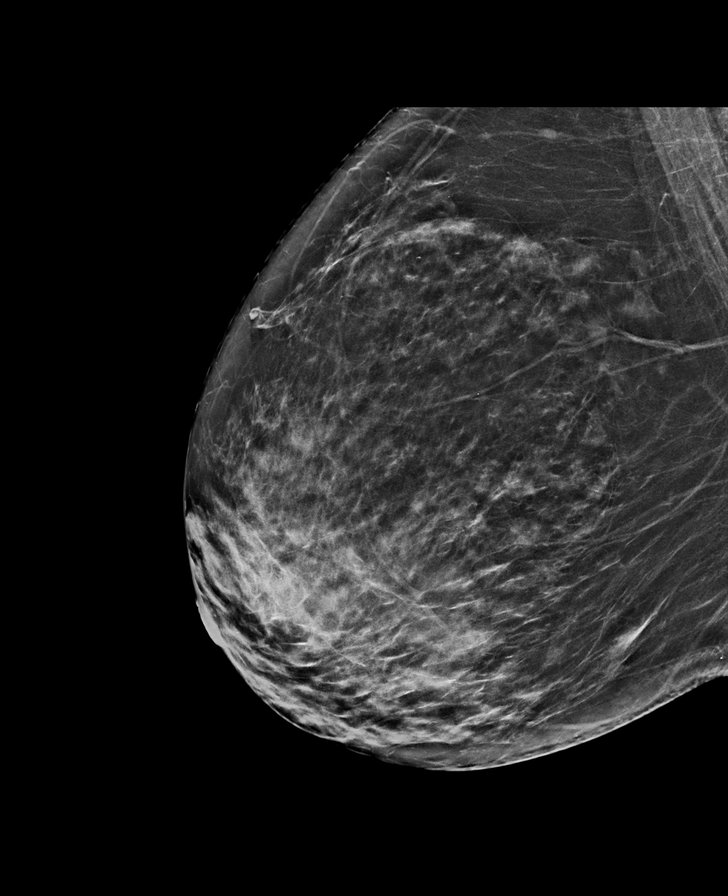

[L MLO synth-2D]
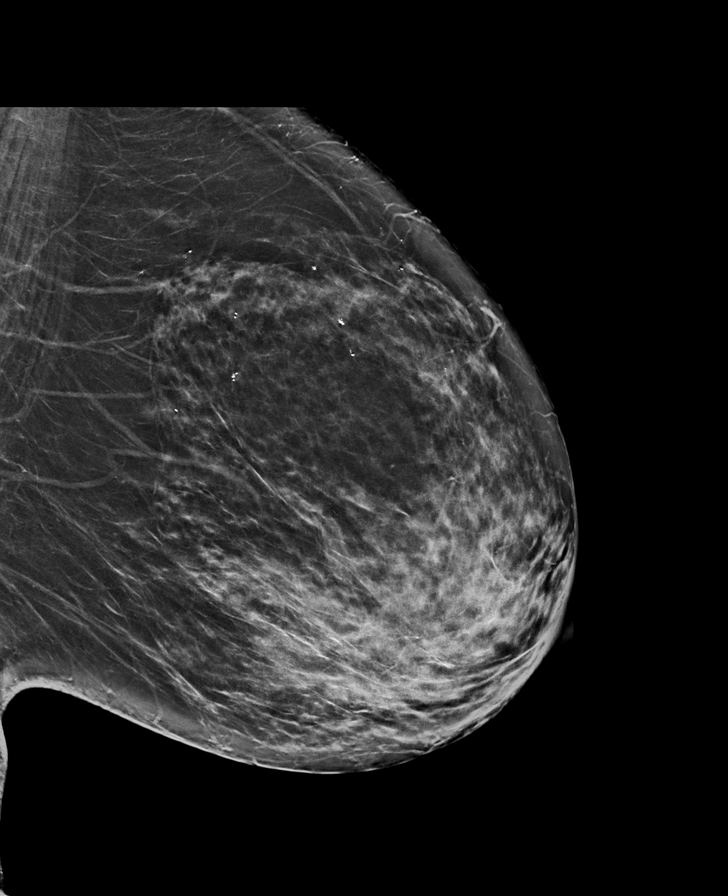

[R CC synth-2D]
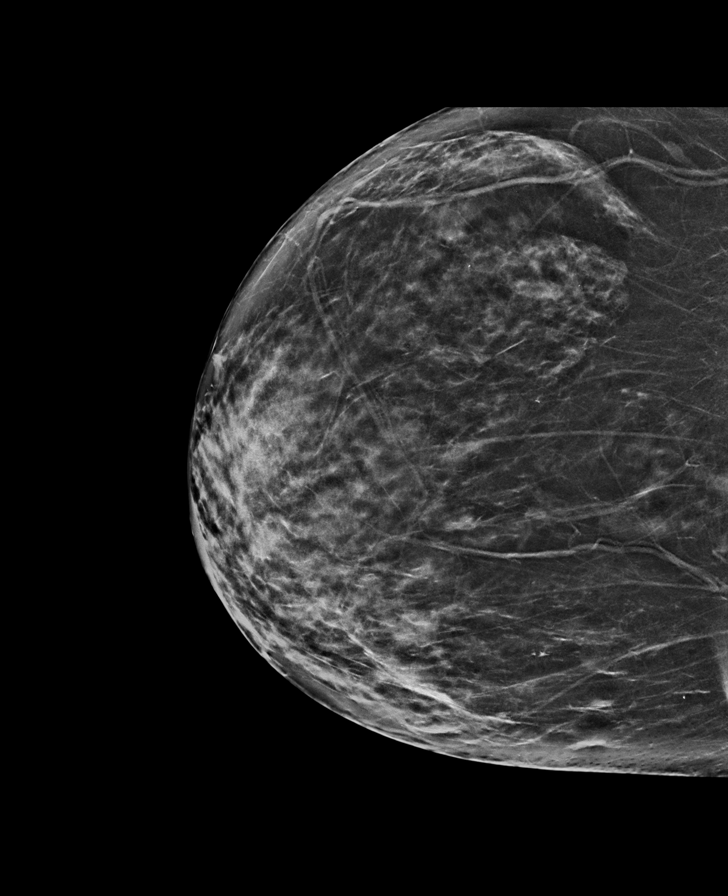

[L CC synth-2D]
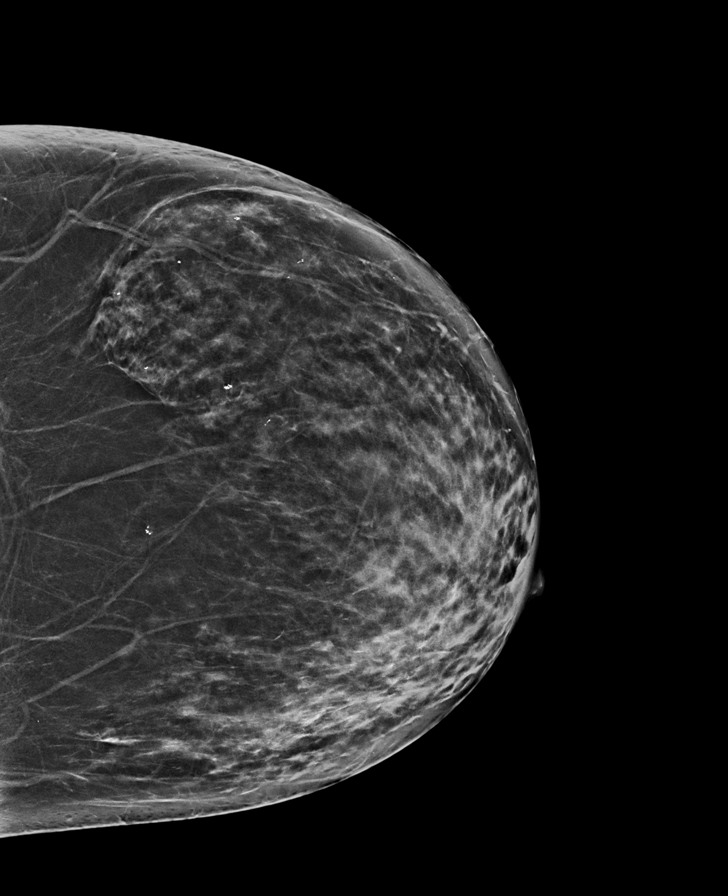

[R CC tomo · tomo slice 35/69.0]
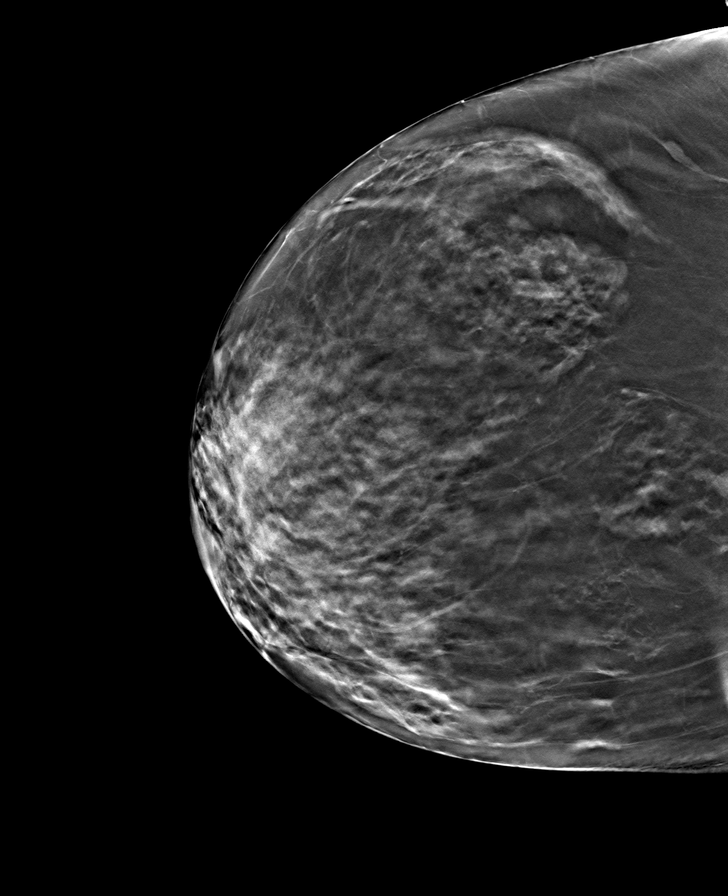

[L MLO tomo · tomo slice 37/73.0]
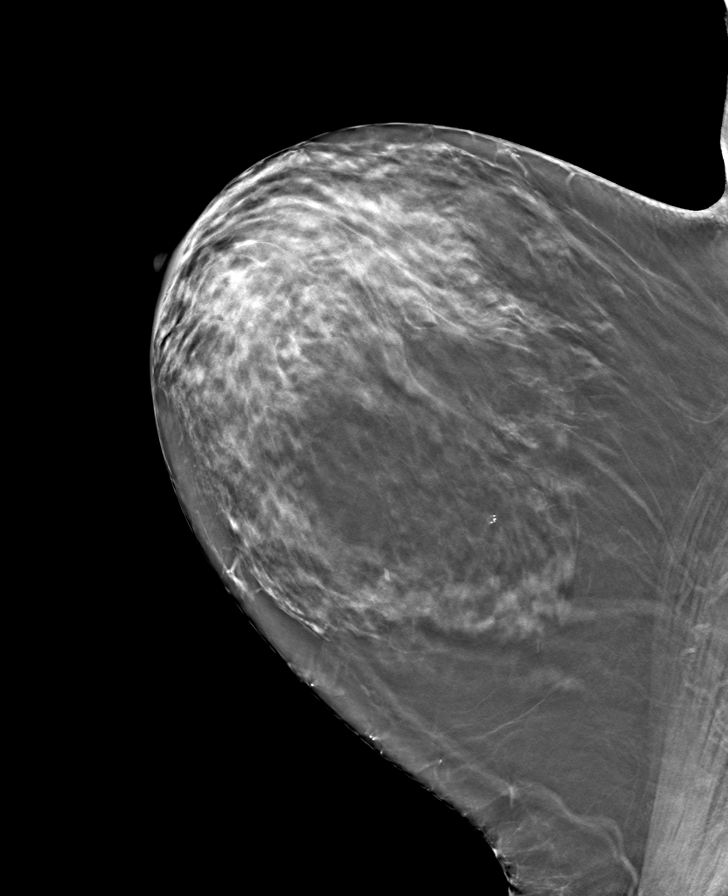

[L CC tomo · tomo slice 36/71.0]
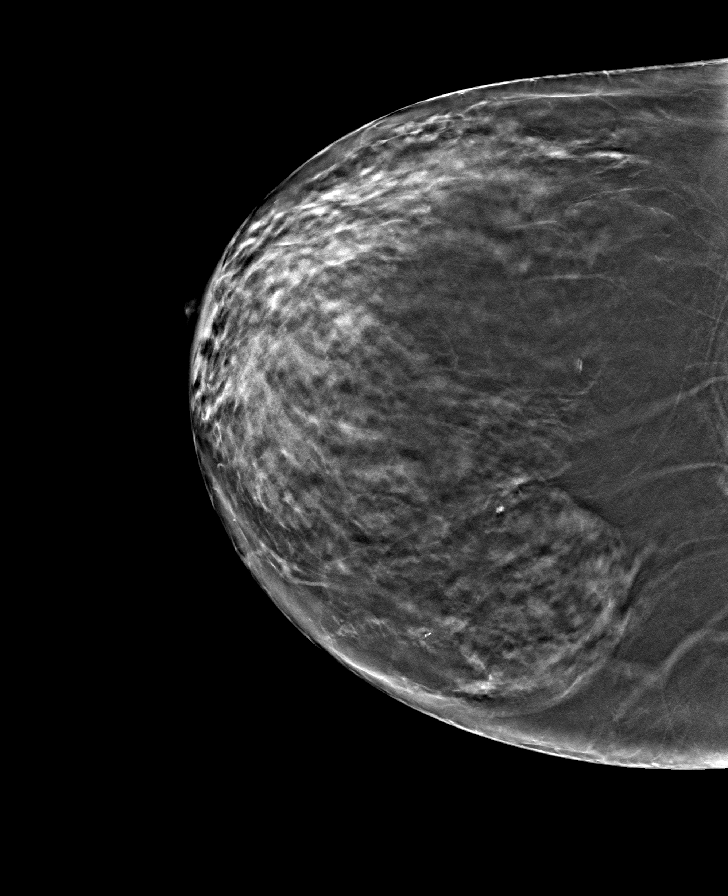

[R MLO tomo · tomo slice 35/70.0]
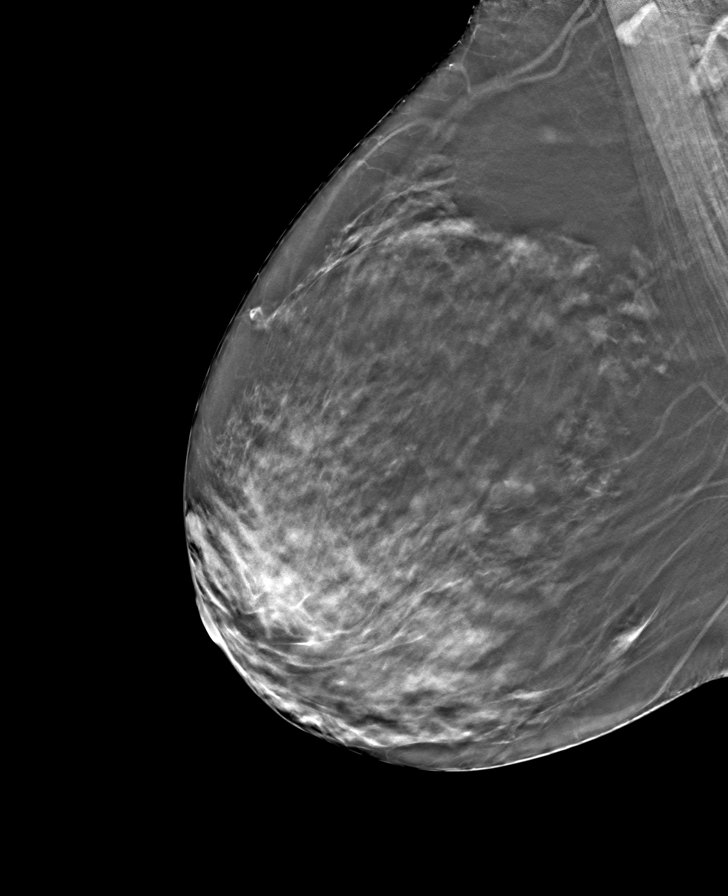

[8 of 24 positions shown; findings below may reference images not displayed]

ACR Breast Density Category c: The breast tissue is heterogeneously
dense, which may obscure small masses.
FINDINGS: There are no findings suspicious for malignancy.
IMPRESSION: No mammographic evidence of malignancy. A result letter of this
screening mammogram will be mailed directly to the patient.

RECOMMENDATION:
Screening mammogram in one year. (Code:Q3-W-BC3)

BI-RADS CATEGORY  1: Negative.

## 2023-08-17 ENCOUNTER — Telehealth: Payer: Self-pay | Admitting: Primary Care

## 2023-08-17 NOTE — Telephone Encounter (Signed)
 Boyfriend dropped off handiclap placecard to be completed by provider, put in providers box at front desk.

## 2023-08-18 NOTE — Telephone Encounter (Signed)
 Completed and placed in Kelli's inbox. Also, patient due for CPE in mid to late October.

## 2023-08-19 NOTE — Telephone Encounter (Signed)
Picked up by boyfriend

## 2023-08-19 NOTE — Telephone Encounter (Signed)
 Advised patient form is ready to be picked up with reception.  Stated she needs to check her work schedule and then call back to schedule CPE.

## 2023-09-02 ENCOUNTER — Other Ambulatory Visit: Payer: Self-pay | Admitting: Primary Care

## 2023-09-02 DIAGNOSIS — I10 Essential (primary) hypertension: Secondary | ICD-10-CM

## 2023-09-02 NOTE — Telephone Encounter (Signed)
Patient is due for CPE/follow up in mid October, this will be required prior to any further refills.  Please schedule, thank you!

## 2023-09-02 NOTE — Telephone Encounter (Signed)
 VM is not set up, sent MyChart

## 2023-11-13 ENCOUNTER — Ambulatory Visit: Admitting: Primary Care

## 2023-11-13 ENCOUNTER — Ambulatory Visit: Payer: Self-pay | Admitting: Primary Care

## 2023-11-13 ENCOUNTER — Encounter: Payer: Self-pay | Admitting: Primary Care

## 2023-11-13 VITALS — BP 146/86 | HR 114 | Temp 97.8°F | Ht 64.0 in | Wt 160.0 lb

## 2023-11-13 DIAGNOSIS — G7100 Muscular dystrophy, unspecified: Secondary | ICD-10-CM

## 2023-11-13 DIAGNOSIS — Z23 Encounter for immunization: Secondary | ICD-10-CM

## 2023-11-13 DIAGNOSIS — I10 Essential (primary) hypertension: Secondary | ICD-10-CM | POA: Diagnosis not present

## 2023-11-13 DIAGNOSIS — Z0000 Encounter for general adult medical examination without abnormal findings: Secondary | ICD-10-CM | POA: Diagnosis not present

## 2023-11-13 DIAGNOSIS — Z1211 Encounter for screening for malignant neoplasm of colon: Secondary | ICD-10-CM

## 2023-11-13 DIAGNOSIS — R000 Tachycardia, unspecified: Secondary | ICD-10-CM

## 2023-11-13 DIAGNOSIS — M545 Low back pain, unspecified: Secondary | ICD-10-CM

## 2023-11-13 DIAGNOSIS — Z800 Family history of malignant neoplasm of digestive organs: Secondary | ICD-10-CM

## 2023-11-13 DIAGNOSIS — R7303 Prediabetes: Secondary | ICD-10-CM | POA: Diagnosis not present

## 2023-11-13 DIAGNOSIS — G8929 Other chronic pain: Secondary | ICD-10-CM

## 2023-11-13 DIAGNOSIS — Z0001 Encounter for general adult medical examination with abnormal findings: Secondary | ICD-10-CM

## 2023-11-13 LAB — COMPREHENSIVE METABOLIC PANEL WITH GFR
ALT: 26 U/L (ref 0–35)
AST: 24 U/L (ref 0–37)
Albumin: 4.5 g/dL (ref 3.5–5.2)
Alkaline Phosphatase: 68 U/L (ref 39–117)
BUN: 8 mg/dL (ref 6–23)
CO2: 29 meq/L (ref 19–32)
Calcium: 9.7 mg/dL (ref 8.4–10.5)
Chloride: 98 meq/L (ref 96–112)
Creatinine, Ser: 0.4 mg/dL (ref 0.40–1.20)
GFR: 101.49 mL/min (ref >60.00)
Glucose, Bld: 137 mg/dL — ABNORMAL HIGH (ref 70–99)
Potassium: 3.8 meq/L (ref 3.5–5.1)
Sodium: 141 meq/L (ref 135–145)
Total Bilirubin: 0.8 mg/dL (ref 0.2–1.2)
Total Protein: 7.6 g/dL (ref 6.0–8.3)

## 2023-11-13 LAB — LIPID PANEL
Cholesterol: 203 mg/dL — ABNORMAL HIGH (ref 0–200)
HDL: 87.6 mg/dL (ref >39.00)
LDL Cholesterol: 99 mg/dL (ref 0–99)
NonHDL: 115.34
Total CHOL/HDL Ratio: 2
Triglycerides: 84 mg/dL (ref 0.0–149.0)
VLDL: 16.8 mg/dL (ref 0.0–40.0)

## 2023-11-13 LAB — HEMOGLOBIN A1C: Hgb A1c MFr Bld: 5.5 % (ref 4.6–6.5)

## 2023-11-13 MED ORDER — LOSARTAN POTASSIUM 100 MG PO TABS: 100.0000 mg | ORAL_TABLET | Freq: Every day | ORAL | 0 refills | Status: DC

## 2023-11-13 NOTE — Progress Notes (Signed)
 Subjective:    Patient ID: Jeanne Taylor, female    DOB: May 03, 1955, 68 y.o.   MRN: [id number]  Jeanne Taylor is a very pleasant 68 y.o. female who presents today for complete physical and follow up of chronic conditions.  Immunizations: -Tetanus: Completed in 2019 -Influenza: Influenza vaccine provided today.  -Shingles: Completed Shingrix  series -Pneumonia: Completed Prevnar 20 in 2023  Diet: Fair diet.  Exercise: No regular exercise.  Eye exam: Completes annually  Dental exam: Completes semi-annually    Mammogram: Completed in 2022, declines  Bone Density Scan: Completed in 2022, declines.   Colonoscopy: Never completed colonoscopy or Cologuard.  Declined last year. Agrees to [employer].   BP Readings from Last 3 Encounters:  11/13/23 (!) 146/86  11/06/22 (!) 146/86  10/31/21 138/82     She does not check her BP at home.  She is compliant to amlodipine  10 mg daily and losartan  50 mg daily.   Review of Systems  Constitutional:  Negative for unexpected weight change.  HENT:  Negative for rhinorrhea.   Respiratory:  Negative for cough and shortness of breath.   Cardiovascular:  Negative for chest pain.  Gastrointestinal:  Negative for constipation and diarrhea.  Genitourinary:  Negative for difficulty urinating.  Musculoskeletal:  Negative for arthralgias and myalgias.  Skin:  Negative for rash.  Allergic/Immunologic: Negative for environmental allergies.  Neurological:  Positive for weakness. Negative for dizziness and headaches.  Psychiatric/Behavioral:  The patient is not nervous/anxious.          Past Medical History:  Diagnosis Date   Anxiety    Hyperlipidemia    Hypertension    Muscular dystrophy (HCC)    Persistent cough for 3 weeks or longer 10/31/2021    Social History   Socioeconomic History   Marital status: Divorced    Spouse name: Not on file   Number of children: Not on file   Years of education: Not on file   Highest education level:  Some college, no degree  Occupational History   Not on file  Tobacco Use   Smoking status: Never   Smokeless tobacco: Never  Vaping Use   Vaping status: Never Used  Substance and Sexual Activity   Alcohol use: Yes    Alcohol/week: 0.0 standard drinks of alcohol    Comment: social   Drug use: No   Sexual activity: Not on file  Other Topics Concern   Not on file  Social History Narrative   Single.   1 daughter, adopted.   No grandchildren.   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   Lives at home with her Godson.    Enjoys cooking, cleaning, decorating.    Social Drivers of Corporate investment banker Strain: Not on file  Food Insecurity: No Food Insecurity (11/09/2023)   Hunger Vital Sign    Worried About Running Out of Food in the Last Year: Never true    Ran Out of Food in the Last Year: Never true  Transportation Needs: No Transportation Needs (11/09/2023)   PRAPARE - Administrator, Civil Service (Medical): No    Lack of Transportation (Non-Medical): No  Physical Activity: Inactive (11/09/2023)   Exercise Vital Sign    Days of Exercise per Week: 0 days    Minutes of Exercise per Session: Not on file  Stress: No Stress Concern Present (11/09/2023)   [facility] [employer] - Occupational Stress Questionnaire    Feeling of Stress: Only a little  Social Connections:  Unknown (11/09/2023)   Social Connection and Isolation Panel    Frequency of Communication with Friends and Family: More than three times a week    Frequency of Social Gatherings with Friends and Family: Patient declined    Attends Religious Services: Patient declined    Database administrator or Organizations: Not on file    Attends Banker Meetings: Not on file    Marital Status: Patient declined  Catering manager Violence: Not on file    Past Surgical History:  Procedure Laterality Date   BUNIONECTOMY Right     Family History  Problem Relation Age of Onset    Colon cancer Mother    Hypertension Father    Muscular dystrophy Brother     No Known Allergies  Current Outpatient Medications on File Prior to Visit  Medication Sig Dispense Refill   amLODipine  (NORVASC ) 10 MG tablet TAKE 1 TABLET BY MOUTH EVERY DAY FOR BLOOD PRESSURE 90 tablet 0   etodolac  (LODINE ) 400 MG tablet Take 1 tablet (400 mg total) by mouth 2 (two) times daily as needed for moderate pain. 60 tablet 0   naproxen  (NAPROSYN ) 500 MG tablet Take 1 tablet (500 mg total) by mouth 2 (two) times daily with a meal. 30 tablet 0   VITAMIN D  PO Take by mouth.     metoprolol  tartrate (LOPRESSOR ) 25 MG tablet Take 0.5 tablets (12.5 mg total) by mouth daily as needed. (Patient not taking: Reported on 11/13/2023) 30 tablet 0   No current facility-administered medications on file prior to visit.    BP (!) 146/86   Pulse (!) 114   Temp 97.8 F (36.6 C) (Temporal)   Ht 5' 4 (1.626 m)   Wt 160 lb (72.6 kg)   SpO2 97%   BMI 27.46 kg/m  Objective:   Physical Exam HENT:     Right Ear: Tympanic membrane and ear canal normal.     Left Ear: Tympanic membrane and ear canal normal.  Eyes:     Pupils: Pupils are equal, round, and reactive to light.  Cardiovascular:     Rate and Rhythm: Normal rate and regular rhythm.  Pulmonary:     Effort: Pulmonary effort is normal.     Breath sounds: Normal breath sounds.  Abdominal:     General: Bowel sounds are normal.     Palpations: Abdomen is soft.     Tenderness: There is no abdominal tenderness.  Musculoskeletal:     Cervical back: Neck supple.     Comments: Altered gait with [place].   Skin:    General: Skin is warm and dry.  Neurological:     Mental Status: She is alert and oriented to person, place, and time.     Cranial Nerves: No cranial nerve deficit.     Deep Tendon Reflexes:     Reflex Scores:      Patellar reflexes are 2+ on the right side and 2+ on the left side. Psychiatric:        Mood and Affect: Mood normal.      Physical Exam        Assessment & Plan:  Encounter for annual general medical examination with abnormal findings in adult Assessment & Plan: Immunizations UTD. Influenza vaccine provided today.  Mammogram and bone density scan overdue, she declines despite recommendations Colonoscopy overdue, she continues to decline but agrees to Cologuard, orders placed.   Discussed the importance of a healthy diet and regular exercise in order for weight  loss, and to reduce the risk of further co-morbidity.  Exam stable. Labs pending.  Follow up in 1 year for repeat physical.    Screening for colon cancer -     Cologuard -     Cologuard  Muscular dystrophy (HCC) Assessment & Plan: Stable.  Offered physical therapy for which she kindly declines for now but will think about it. If she decides to go, she would like to go to a [place] location for which is not on [facility] campus.   Sinus tachycardia Assessment & Plan: Controlled.  Continue metoprolol  to tartrate 12.5 mg as needed.   Prediabetes Assessment & Plan: Repeat A1c pending.  Orders: -     Lipid panel -     Hemoglobin A1c  Family history of colon cancer Assessment & Plan: Declines colonoscopy but agrees to complete Cologuard. Orders placed.  Orders: -     Cologuard  Primary hypertension Assessment & Plan: Above goal today, also recheck  Given continued elevated readings we will treat.  She agrees. Continue amlodipine  10 mg daily. Increase losartan  to 100 mg daily.  I have asked for her to send blood pressure readings in 2 weeks as it is difficult for her to get to and from the clinic.   Orders: -     Comprehensive metabolic panel with GFR -     Losartan  Potassium; Take 1 tablet (100 mg total) by mouth daily. for blood pressure.  Dispense: 90 tablet; Refill: 0  Chronic low back pain without sciatica, unspecified back pain laterality Assessment & Plan: Stable.  Continue etodolac  400 mg as  needed.   Encounter for immunization -     Flu vaccine HIGH DOSE PF(Fluzone Trivalent)    Assessment and Plan Assessment & Plan         Comer MARLA Gaskins, NP     History of Present Illness

## 2023-11-13 NOTE — Assessment & Plan Note (Signed)
 Immunizations UTD. Influenza vaccine provided today.  Mammogram and bone density scan overdue, she declines despite recommendations Colonoscopy overdue, she continues to decline but agrees to Cologuard, orders placed.   Discussed the importance of a healthy diet and regular exercise in order for weight loss, and to reduce the risk of further co-morbidity.  Exam stable. Labs pending.  Follow up in 1 year for repeat physical.

## 2023-11-13 NOTE — Patient Instructions (Addendum)
 Complete the Cologuard Kit once received for colon cancer screening.   Stop by the lab prior to leaving today. I will notify you of your results once received.   Stop taking losartan  50 mg daily for blood pressure.  We increase your losartan  to 100 mg daily for blood pressure.  I sent a new prescription to your pharmacy.  Continue taking amlodipine  10 mg daily for blood pressure.  Please send your blood pressure readings via MyChart in 2 weeks or schedule a follow-up visit for 2 weeks in the office.  It was a pleasure to see you today!

## 2023-11-13 NOTE — Assessment & Plan Note (Signed)
 Above goal today, also recheck  Given continued elevated readings we will treat.  She agrees. Continue amlodipine  10 mg daily. Increase losartan  to 100 mg daily.  I have asked for her to send blood pressure readings in 2 weeks as it is difficult for her to get to and from the clinic.

## 2023-11-13 NOTE — Assessment & Plan Note (Signed)
 Stable.  Continue etodolac  400 mg as needed.

## 2023-11-13 NOTE — Assessment & Plan Note (Signed)
 Repeat A1c pending

## 2023-11-13 NOTE — Assessment & Plan Note (Signed)
 Controlled.  Continue metoprolol  to tartrate 12.5 mg as needed.

## 2023-11-13 NOTE — Assessment & Plan Note (Signed)
 Stable.  Offered physical therapy for which she kindly declines for now but will think about it. If she decides to go, she would like to go to a [place] location for which is not on [facility] campus.

## 2023-11-13 NOTE — Assessment & Plan Note (Signed)
 Declines colonoscopy but agrees to complete Cologuard. Orders placed.

## 2023-11-29 ENCOUNTER — Other Ambulatory Visit: Payer: Self-pay | Admitting: Primary Care

## 2023-11-29 DIAGNOSIS — I10 Essential (primary) hypertension: Secondary | ICD-10-CM

## 2024-02-10 ENCOUNTER — Other Ambulatory Visit: Payer: Self-pay | Admitting: Primary Care

## 2024-02-10 DIAGNOSIS — I10 Essential (primary) hypertension: Secondary | ICD-10-CM
